# Patient Record
Sex: Female | Born: 1985 | Race: White | Hispanic: No | Marital: Married | Smoking: Former smoker
Health system: Southern US, Community
[De-identification: ages and names within clinical notes are randomized; demographics above are authoritative.]

## PROBLEM LIST (undated history)

## (undated) DIAGNOSIS — N3010 Interstitial cystitis (chronic) without hematuria: Secondary | ICD-10-CM

## (undated) DIAGNOSIS — R351 Nocturia: Secondary | ICD-10-CM

## (undated) DIAGNOSIS — D500 Iron deficiency anemia secondary to blood loss (chronic): Secondary | ICD-10-CM

## (undated) DIAGNOSIS — G8929 Other chronic pain: Secondary | ICD-10-CM

## (undated) DIAGNOSIS — Z87820 Personal history of traumatic brain injury: Secondary | ICD-10-CM

## (undated) DIAGNOSIS — R102 Pelvic and perineal pain: Secondary | ICD-10-CM

## (undated) HISTORY — PX: EXPLORATORY LAPAROTOMY: SUR591

---
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
## 2006-08-28 ENCOUNTER — Encounter: Admission: RE | Admit: 2006-08-28 | Discharge: 2006-08-28 | Payer: Self-pay | Admitting: Occupational Medicine

## 2007-04-03 ENCOUNTER — Emergency Department (HOSPITAL_COMMUNITY): Admission: EM | Admit: 2007-04-03 | Discharge: 2007-04-03 | Payer: Self-pay | Admitting: Emergency Medicine

## 2007-10-22 ENCOUNTER — Inpatient Hospital Stay (HOSPITAL_COMMUNITY): Admission: AD | Admit: 2007-10-22 | Discharge: 2007-10-22 | Payer: Self-pay | Admitting: Obstetrics

## 2007-12-22 ENCOUNTER — Inpatient Hospital Stay (HOSPITAL_COMMUNITY): Admission: AD | Admit: 2007-12-22 | Discharge: 2007-12-22 | Payer: Self-pay | Admitting: Obstetrics

## 2008-03-01 ENCOUNTER — Inpatient Hospital Stay (HOSPITAL_COMMUNITY): Admission: AD | Admit: 2008-03-01 | Discharge: 2008-03-05 | Payer: Self-pay | Admitting: Obstetrics

## 2008-03-03 ENCOUNTER — Encounter: Payer: Self-pay | Admitting: Obstetrics

## 2008-08-20 ENCOUNTER — Emergency Department (HOSPITAL_COMMUNITY): Admission: EM | Admit: 2008-08-20 | Discharge: 2008-08-20 | Payer: Self-pay | Admitting: Emergency Medicine

## 2009-07-16 ENCOUNTER — Emergency Department (HOSPITAL_COMMUNITY): Admission: EM | Admit: 2009-07-16 | Discharge: 2009-07-16 | Payer: Self-pay | Admitting: Emergency Medicine

## 2009-09-21 ENCOUNTER — Encounter: Admission: RE | Admit: 2009-09-21 | Discharge: 2009-09-21 | Payer: Self-pay | Admitting: Family Medicine

## 2010-02-20 ENCOUNTER — Encounter: Payer: Self-pay | Admitting: Family Medicine

## 2010-04-18 LAB — URINALYSIS, ROUTINE W REFLEX MICROSCOPIC
Glucose, UA: NEGATIVE mg/dL
Hgb urine dipstick: NEGATIVE
Ketones, ur: NEGATIVE mg/dL
Nitrite: NEGATIVE
Protein, ur: NEGATIVE mg/dL
Urobilinogen, UA: 0.2 mg/dL (ref 0.0–1.0)
pH: 5 (ref 5.0–8.0)

## 2010-04-18 LAB — POCT PREGNANCY, URINE: Preg Test, Ur: NEGATIVE

## 2010-04-18 LAB — URINE MICROSCOPIC-ADD ON

## 2010-05-09 LAB — URINALYSIS, ROUTINE W REFLEX MICROSCOPIC
Glucose, UA: NEGATIVE mg/dL
Ketones, ur: NEGATIVE mg/dL
Nitrite: NEGATIVE

## 2010-05-09 LAB — DIFFERENTIAL
Basophils Absolute: 0 10*3/uL (ref 0.0–0.1)
Basophils Relative: 1 % (ref 0–1)
Eosinophils Absolute: 0.1 10*3/uL (ref 0.0–0.7)
Eosinophils Relative: 1 % (ref 0–5)
Monocytes Absolute: 0.6 10*3/uL (ref 0.1–1.0)
Monocytes Relative: 7 % (ref 3–12)
Neutro Abs: 5.5 10*3/uL (ref 1.7–7.7)
Neutrophils Relative %: 69 % (ref 43–77)

## 2010-05-09 LAB — BASIC METABOLIC PANEL
BUN: 8 mg/dL (ref 6–23)
CO2: 27 mEq/L (ref 19–32)
Chloride: 107 mEq/L (ref 96–112)
Creatinine, Ser: 0.57 mg/dL (ref 0.4–1.2)
Glucose, Bld: 107 mg/dL — ABNORMAL HIGH (ref 70–99)

## 2010-05-09 LAB — CBC
MCHC: 34.7 g/dL (ref 30.0–36.0)
MCV: 87.7 fL (ref 78.0–100.0)
RBC: 4.38 MIL/uL (ref 3.87–5.11)

## 2010-05-09 LAB — POCT PREGNANCY, URINE: Preg Test, Ur: NEGATIVE

## 2010-05-17 LAB — DIFFERENTIAL
Basophils Absolute: 0.1 10*3/uL (ref 0.0–0.1)
Basophils Relative: 1 % (ref 0–1)
Eosinophils Absolute: 0 10*3/uL (ref 0.0–0.7)
Lymphocytes Relative: 17 % (ref 12–46)
Monocytes Absolute: 0.7 10*3/uL (ref 0.1–1.0)
Neutrophils Relative %: 76 % (ref 43–77)

## 2010-05-17 LAB — CBC
HCT: 38.6 % (ref 36.0–46.0)
Hemoglobin: 13 g/dL (ref 12.0–15.0)
MCHC: 33.5 g/dL (ref 30.0–36.0)
RDW: 12.6 % (ref 11.5–15.5)

## 2010-05-17 LAB — RPR: RPR Ser Ql: NONREACTIVE

## 2010-05-17 LAB — COMPREHENSIVE METABOLIC PANEL
AST: 25 U/L (ref 0–37)
Albumin: 2.8 g/dL — ABNORMAL LOW (ref 3.5–5.2)
Alkaline Phosphatase: 135 U/L — ABNORMAL HIGH (ref 39–117)
BUN: 3 mg/dL — ABNORMAL LOW (ref 6–23)
Potassium: 4.4 mEq/L (ref 3.5–5.1)
Total Protein: 5.7 g/dL — ABNORMAL LOW (ref 6.0–8.3)

## 2010-05-17 LAB — MAGNESIUM: Magnesium: 8.8 mg/dL (ref 1.5–2.5)

## 2010-05-17 LAB — WET PREP, GENITAL
Clue Cells Wet Prep HPF POC: NONE SEEN
Yeast Wet Prep HPF POC: NONE SEEN

## 2010-05-17 LAB — STREP B DNA PROBE

## 2010-05-18 LAB — CBC
Hemoglobin: 10.3 g/dL — ABNORMAL LOW (ref 12.0–15.0)
RBC: 3.42 MIL/uL — ABNORMAL LOW (ref 3.87–5.11)
WBC: 12.9 10*3/uL — ABNORMAL HIGH (ref 4.0–10.5)

## 2010-11-01 LAB — URINALYSIS, ROUTINE W REFLEX MICROSCOPIC
Glucose, UA: NEGATIVE
Specific Gravity, Urine: 1.01

## 2010-11-01 LAB — URINE MICROSCOPIC-ADD ON

## 2010-11-02 LAB — URINALYSIS, ROUTINE W REFLEX MICROSCOPIC
Bilirubin Urine: NEGATIVE
Nitrite: NEGATIVE
Specific Gravity, Urine: 1.02
Urobilinogen, UA: 0.2

## 2011-03-18 ENCOUNTER — Inpatient Hospital Stay (HOSPITAL_COMMUNITY)
Admission: AD | Admit: 2011-03-18 | Discharge: 2011-03-19 | Disposition: A | Discharge status: Home / Self Care | Payer: 59 | Source: Ambulatory Visit | Attending: Obstetrics and Gynecology | Admitting: Obstetrics and Gynecology

## 2011-03-18 DIAGNOSIS — N949 Unspecified condition associated with female genital organs and menstrual cycle: Secondary | ICD-10-CM | POA: Insufficient documentation

## 2011-03-18 NOTE — Progress Notes (Signed)
"  I stopped taking my medication about at the beginning of December, because we wanted to see how my body would react and we want to get pregnant.  The pain got really bad yesterday and it feels little needles are sticking me down there.  No pain or burning with urination.  No blood in urine.  The pain hurts worse when I have to go pee."

## 2011-03-18 NOTE — Progress Notes (Signed)
Pt states, " I stopped my medicine for interstitial cystitis in December because I wanted to see how my body reacted. I started having pain again yesterday. I took tramadal at 2 pm but it didn't help. I'm having nausea. I took at home pregnancy test and it was negative."

## 2011-03-19 ENCOUNTER — Inpatient Hospital Stay (HOSPITAL_COMMUNITY): Payer: 59

## 2011-03-19 ENCOUNTER — Encounter (HOSPITAL_COMMUNITY): Payer: Self-pay | Admitting: *Deleted

## 2011-03-19 ENCOUNTER — Emergency Department (HOSPITAL_COMMUNITY)
Admission: EM | Admit: 2011-03-19 | Discharge: 2011-03-19 | Disposition: A | Discharge status: Home / Self Care | Payer: 59 | Attending: Emergency Medicine | Admitting: Emergency Medicine

## 2011-03-19 DIAGNOSIS — R102 Pelvic and perineal pain: Secondary | ICD-10-CM

## 2011-03-19 DIAGNOSIS — R109 Unspecified abdominal pain: Secondary | ICD-10-CM | POA: Insufficient documentation

## 2011-03-19 DIAGNOSIS — R110 Nausea: Secondary | ICD-10-CM | POA: Insufficient documentation

## 2011-03-19 DIAGNOSIS — N3010 Interstitial cystitis (chronic) without hematuria: Secondary | ICD-10-CM

## 2011-03-19 DIAGNOSIS — N949 Unspecified condition associated with female genital organs and menstrual cycle: Secondary | ICD-10-CM | POA: Insufficient documentation

## 2011-03-19 HISTORY — DX: Interstitial cystitis (chronic) without hematuria: N30.10

## 2011-03-19 LAB — URINALYSIS, ROUTINE W REFLEX MICROSCOPIC
Ketones, ur: NEGATIVE mg/dL
Protein, ur: NEGATIVE mg/dL
Urobilinogen, UA: 0.2 mg/dL (ref 0.0–1.0)

## 2011-03-19 LAB — URINE MICROSCOPIC-ADD ON

## 2011-03-19 MED ORDER — HYDROCODONE-ACETAMINOPHEN 5-325 MG PO TABS: 2.0000 | ORAL_TABLET | Freq: Three times a day (TID) | ORAL | Status: DC | PRN

## 2011-03-19 MED ORDER — HYDROCODONE-ACETAMINOPHEN 5-325 MG PO TABS
2.0000 | ORAL_TABLET | Freq: Once | ORAL | Status: AC
  Administered 2011-03-19: 2 via ORAL
  Filled 2011-03-19: qty 2

## 2011-03-19 NOTE — ED Provider Notes (Signed)
History     CSN: [id number]  Arrival date & time 03/19/11  4098   First MD Initiated Contact with Patient 03/19/11 [phone]      Chief Complaint  Patient presents with  . Abdominal Pain    (Consider location/radiation/quality/duration/timing/severity/associated sxs/prior treatment) HPI This 26 year old female has a history of chronic pelvic pain and diagnosed with interstitial cystitis who is trying to become pregnant and stopped her medicine for interstitial cystitis in December. Since that time she has had gradually worsening pain in her pelvis. The pain is actually somewhat relieved when she urinates and she has no vaginal bleeding or discharge. She's also had no fever. She has no cough. Her abdomen hurts worse if she breathes but does nicely have exertional shortness of breath. She is no rashes no trauma. She does not have control of her pain with tramadol. She had a normal urinalysis and normal pelvic ultrasound last night at [facility] for the same problem. She has a follow up appointment with her gynecologist for this within 5 days. She used to have better control of her pain with Vicodin but did not get a prescription for that from [facility] hospital so she came to the ED here. She also has had some nausea but no vomiting or diarrhea. Her pain has become gradually severe over the last 2-3 days. Her pain is constant and radiates to her entire body without associated symptoms other than nausea. She did not have a pregnancy test performed at [facility] because she states she had a home pregnancy test that was negative. Past Medical History  Diagnosis Date  . Interstitial cystitis     History reviewed. No pertinent past surgical history.  History reviewed. No pertinent family history.  History  Substance Use Topics  . Smoking status: Not on file  . Smokeless tobacco: Not on file  . Alcohol Use: No    OB History    Grav Para Term Preterm Abortions TAB SAB Ect Mult Living                  Review of Systems  Constitutional: Negative for fever.       10 Systems reviewed and are negative for acute change except as noted in the HPI.  HENT: Negative for congestion.   Eyes: Negative for discharge and redness.  Respiratory: Negative for cough and shortness of breath.   Cardiovascular: Negative for chest pain.  Gastrointestinal: Positive for nausea and abdominal pain. Negative for vomiting and diarrhea.  Genitourinary: Negative for vaginal bleeding and vaginal discharge.  Musculoskeletal: Negative for back pain.  Skin: Negative for rash.  Neurological: Negative for syncope, numbness and headaches.  Psychiatric/Behavioral:       No behavior change.    Allergies  Review of patient's allergies indicates no known allergies.  Home Medications   Current Outpatient Rx  Name Route Sig Dispense Refill  . URIBEL PO Oral Take 1 capsule by mouth 3 (three) times a week. For bladder pain and frequent urination    . TRAMADOL HCL 50 MG PO TABS Oral Take 50 mg by mouth every 6 (six) hours as needed. For pain    . HYDROCODONE-ACETAMINOPHEN 5-325 MG PO TABS Oral Take 2 tablets by mouth every 8 (eight) hours as needed for pain. 20 tablet 0    BP 113/79  Pulse 87  Temp(Src) 97.7 F (36.5 C) (Oral)  Resp 20  SpO2 100%  LMP 03/04/2011  Physical Exam  Nursing note and vitals reviewed. Constitutional:  Awake, alert, nontoxic appearance.  HENT:  Head: Atraumatic.  Eyes: Right eye exhibits no discharge. Left eye exhibits no discharge.  Neck: Neck supple.  Cardiovascular: Normal rate and regular rhythm.   No murmur heard. Pulmonary/Chest: Effort normal and breath sounds normal. No respiratory distress. She has no wheezes. She has no rales. She exhibits no tenderness.  Abdominal: Soft. Bowel sounds are normal. She exhibits no mass. There is tenderness. There is no rebound and no guarding.       Moderate suprapubic tenderness only the rest of the abdomen is soft and  nontender and no rebound present; no CVA tenderness  Genitourinary:       Chaperone was present for bimanual examination with no adnexal tenderness no cervical motion tenderness no discharge or bleeding noted in the examination glove but the patient does have suprapubic tenderness only  Musculoskeletal: She exhibits no tenderness.       Baseline ROM, no obvious new focal weakness.  Neurological: She is alert.       Mental status and motor strength appears baseline for patient and situation.  Skin: No rash noted.  Psychiatric: She has a normal mood and affect.    ED Course  Procedures (including critical care time)   Labs Reviewed  POCT PREGNANCY, URINE   US Transvaginal Non-ob  03/19/2011  *RADIOLOGY REPORT*  Clinical Data: Pelvic pain; dyspareunia.  TRANSABDOMINAL AND TRANSVAGINAL ULTRASOUND OF PELVIS Technique:  Both transabdominal and transvaginal ultrasound examinations of the pelvis were performed. Transabdominal technique was performed for global imaging of the pelvis including uterus, ovaries, adnexal regions, and pelvic cul-de-sac.  Comparison: Prior ultrasound of pregnancy performed 03/01/2008   It was necessary to proceed with endovaginal exam following the transabdominal exam to visualize the uterus and ovaries in greater detail.  Findings:  Uterus: Normal in size and appearance; measures 7.1 x 3.5 x 4.2 cm.  Endometrium: Normal in thickness and appearance; measures 0.8 cm in thickness.  Right ovary:  Normal appearance/no adnexal mass; measures 4.1 x 2.6 x 2.7 cm.  Left ovary: Normal appearance/no adnexal mass; measures 2.6 x 1.8 x 1.6 cm.  There is no definite evidence for ovarian torsion.  Other findings: No free fluid seen within the pelvic cul-de-sac.  IMPRESSION: Normal study.  No evidence of pelvic mass or other significant abnormality.  Original Report Authenticated By: Tonia Ghent, M.D.   US Pelvis Complete  03/19/2011  *RADIOLOGY REPORT*  Clinical Data: Pelvic pain;  dyspareunia.  TRANSABDOMINAL AND TRANSVAGINAL ULTRASOUND OF PELVIS Technique:  Both transabdominal and transvaginal ultrasound examinations of the pelvis were performed. Transabdominal technique was performed for global imaging of the pelvis including uterus, ovaries, adnexal regions, and pelvic cul-de-sac.  Comparison: Prior ultrasound of pregnancy performed 03/01/2008   It was necessary to proceed with endovaginal exam following the transabdominal exam to visualize the uterus and ovaries in greater detail.  Findings:  Uterus: Normal in size and appearance; measures 7.1 x 3.5 x 4.2 cm.  Endometrium: Normal in thickness and appearance; measures 0.8 cm in thickness.  Right ovary:  Normal appearance/no adnexal mass; measures 4.1 x 2.6 x 2.7 cm.  Left ovary: Normal appearance/no adnexal mass; measures 2.6 x 1.8 x 1.6 cm.  There is no definite evidence for ovarian torsion.  Other findings: No free fluid seen within the pelvic cul-de-sac.  IMPRESSION: Normal study.  No evidence of pelvic mass or other significant abnormality.  Original Report Authenticated By: Tonia Ghent, M.D.     1. Pelvic pain   2. Interstitial  cystitis       MDM  I doubt any other EMC precluding discharge at this time including, but not necessarily limited to the following:SBI, peritonitis.        Hurman Horn, MD 03/19/11 2116

## 2011-03-19 NOTE — H&P (Signed)
Melinda Munoz, Melinda Munoz               ACCOUNT NO.:  [id number]  MEDICAL RECORD NO.:  [id number]  LOCATION:  [facility]10                          FACILITY:  [facility]  PHYSICIAN:  Lenoard Aden, M.D.DATE OF BIRTH:  1985-06-08  DATE OF ADMISSION:  03/18/2011 DATE OF DISCHARGE:  03/19/2011                             HISTORY & PHYSICAL   CHIEF COMPLAINT:  Pelvic pain.  The patient is a 26 year old white female, G1, P1 with a history of diagnosis of interstitial cystitis, previously on nortriptyline, who is now in attempts to conceive, off medication and attempting to get pregnant.  She has had suprapubic pain over the last 24-36 hours.  She has taken tramadol without relief.  She has allergies to CIPRO.  Medications are Uribel as needed, MiraLAX as needed, ibuprofen as needed, and tramadol as noted.  She has a previous history of one uncomplicated vaginal delivery.  She has a family history of diabetes, mental retardation, and skin cancer.  She is a nonsmoker, nondrinker.  Denies domestic or physical violence.  PHYSICAL EXAMINATION:  GENERAL:  She is a well-developed, well- nourished, white female, in no acute distress.  HEENT:  Normal. NECK:  Supple.  Full range of motion. LUNGS:  Clear. HEART:  Regular RHYTHM. ABDOMEN:  Soft, nontender, some positive suprapubic pressure. PELVIC:  Reveals normal-sized uterus and no adnexal masses. EXTREMITIES:  There are no cords. NEUROLOGIC:  Nonfocal. SKIN:  Intact.  IMPRESSION:  Pelvic pain related to previously diagnosed interstitial cystitis, per previous workup by Dr. Juliene Pina.  PLAN:  Will be to obtain pelvic ultrasound.  The patient had a negative pregnancy test at home.  We will discharge home pending ultrasound results.     Lenoard Aden, M.D.     RJT/MEDQ  D:  03/19/2011  T:  03/19/2011  Job:  [phone]

## 2011-03-19 NOTE — ED Notes (Signed)
Reports severe lower abd pain x 2 days, denies vaginal or urinary symptoms.

## 2011-03-19 NOTE — ED Provider Notes (Signed)
History   Suprapubic pain H/O IC  Chief Complaint  Patient presents with  . Pelvic Pain   HPI Dicatated  OB History    No data available      No past medical history on file.  No past surgical history on file.  No family history on file.  History  Substance Use Topics  . Smoking status: Not on file  . Smokeless tobacco: Not on file  . Alcohol Use: Not on file    Allergies: No Known Allergies  Prescriptions prior to admission  Medication Sig Dispense Refill  . Meth-Hyo-M Bl-Na Phos-Ph Sal (URIBEL PO) Take 2-3 capsules by mouth. 2-3 capsules per week as needed      . traMADol (ULTRAM) 50 MG tablet Take 50 mg by mouth once.        ROS otherwise neg Physical Exam dictated   Blood pressure 107/78, pulse 74, temperature 98.6 F (37 C), temperature source Oral, resp. rate 18, height 5\' 1"  (1.549 m), weight 66.452 kg (146 lb 8 oz), last menstrual period 03/04/2011.  Physical Exam dictated  MAU Course  Procedures  MDM na  Assessment and Plan  Suprapubic pain History of IC  Ck UA Ck sono DC home pending results  Maeghan Canny J 03/19/2011, 12:18 AM Sup

## 2011-03-19 NOTE — Discharge Instructions (Signed)
Interstitial Cystitis Interstitial cystitis (IC) is a condition that results in discomfort or pain in the bladder and the surrounding pelvic region. The symptoms can be different from case to case and even in the same individual. People may experience:  Mild discomfort.   Pressure.   Tenderness.   Intense pain in the bladder and pelvic area.  CAUSES  Because IC varies so much in symptoms and severity, people studying this disease believe it is not one but several diseases. Some caregivers use the term painful bladder syndrome (PBS) to describe cases with painful urinary symptoms. This may not meet the strictest definition of IC. The term IC / PBS includes all cases of urinary pain that cannot be connected to other causes, such as infection or urinary stones.  SYMPTOMS  Symptoms may include:  An urgent need to urinate.   A frequent need to urinate.   A combination of these symptoms.  Pain may change in intensity as the bladder fills with urine or as it empties. Women's symptoms often get worse during menstruation. They may sometimes experience pain with vaginal intercourse. Some of the symptoms of IC / PBS seem like those of bacterial infection. Tests do not show infection. IC / PBS is far more common in women than in men.  DIAGNOSIS  The diagnosis of IC / PBS is based on:  Presence of pain related to the bladder, usually along with problems of frequency and urgency.   Not finding other diseases that could cause the symptoms.   Diagnostic tests that help rule out other diseases include:   Urinalysis.   Urine culture.   Cystoscopy.   Biopsy of the bladder wall.   Distension of the bladder under anesthesia.   Urine cytology.   Laboratory examination of prostate secretions.  A biopsy is a tissue sample that can be looked at under a microscope. Samples of the bladder and urethra may be removed during a cystoscopy. A biopsy helps rule out bladder cancer. TREATMENT  Scientists  have not yet found a cure for IC / PBS. Patients with IC / PBS do not get better with antibiotic therapy. Caregivers cannot predict who will respond best to which treatment. Symptoms may disappear without explanation. Disappearing symptoms may coincide with an event such as a change in diet or treatment. Even when symptoms disappear, they may return after days, weeks, months, or years.  Because the causes of IC / PBS are unknown, current treatments are aimed at relieving symptoms. Many people are helped by one or a combination of the treatments. As researchers learn more about IC / PBS, the list of potential treatments will change. Patients should discuss their options with a caregiver. SURGERY  Surgery should be considered only if all available treatments have failed and the pain is disabling. Many approaches and techniques are used. Each approach has its own advantages and complications. Advantages and complications should be discussed with a urologist. Your caregiver may recommend consulting another urologist for a second opinion. Most caregivers are reluctant to operate because the outcome is unpredictable. Some people still have symptoms after surgery.   People considering surgery should discuss the potential risks and benefits, side effects, and long- and short-term complications with their family, as well as with people who have already had the procedure. Surgery requires anesthesia, hospitalization, and in some cases weeks or months of recovery. As the complexity of the procedure increases, so do the chances for complications and for failure.  HOME CARE INSTRUCTIONS   All   drugs, even those sold over the counter, have side effects. Patients should always consult a caregiver before using any drug for an extended amount of time. Only take over-the-counter or prescription medicines for pain, discomfort, or fever as directed by your caregiver.   Many patients feel that smoking makes their symptoms  worse. How the by-products of tobacco that are excreted in the urine affect IC / PBS is unknown. Smoking is the major known cause of bladder cancer. One of the best things smokers can do for their bladder and their overall health is to quit.   Many patients feel that gentle stretching exercises help relieve IC / PBS symptoms.   Methods vary, but basically patients decide to empty their bladder at designated times and use relaxation techniques and distractions to keep to the schedule. Gradually, patients try to lengthen the time between scheduled voids. A diary in which to record voiding times is usually helpful in keeping track of progress.  MAKE SURE YOU:   Understand these instructions.   Will watch your condition.   Will get help right away if you are not doing well or get worse.  Document Released: 09/18/2003 Document Revised: 09/29/2010 Document Reviewed: 12/03/2007 [facility] Patient Information 2012 [place], [place].  Abdominal (belly) pain can be caused by many things. Your caregiver performed an examination and possibly ordered blood/urine tests and imaging (CT scan, x-rays, ultrasound). Many cases can be observed and treated at home after initial evaluation in the emergency department. Even though you are being discharged home, abdominal pain can be unpredictable. Therefore, you need a repeated exam if your pain does not resolve, returns, or worsens. Most patients with abdominal pain don't have to be admitted to the hospital or have surgery, but serious problems like appendicitis and gallbladder attacks can start out as nonspecific pain. Many abdominal conditions cannot be diagnosed in one visit, so follow-up evaluations are very important. SEEK IMMEDIATE MEDICAL ATTENTION IF: The pain does not go away or becomes severe.  A temperature above 101 develops.  Repeated vomiting occurs (multiple episodes).  The pain becomes localized to portions of the abdomen. The right side could possibly be  appendicitis. In an adult, the left lower portion of the abdomen could be colitis or diverticulitis.  Blood is being passed in stools or vomit (bright red or black tarry stools).  Return also if you develop chest pain, difficulty breathing, dizziness or fainting, or become confused, poorly responsive, or inconsolable (young children).

## 2011-03-19 NOTE — Progress Notes (Signed)
Pt informed of order for 2 Tramadol.  Pt refused Tramadol stating, "I just want to go to [facility].  I've been sitting here for 4 hours and I'm hurting."

## 2011-03-23 ENCOUNTER — Other Ambulatory Visit: Payer: Self-pay | Admitting: Urology

## 2011-03-23 ENCOUNTER — Encounter (HOSPITAL_BASED_OUTPATIENT_CLINIC_OR_DEPARTMENT_OTHER): Payer: Self-pay | Admitting: *Deleted

## 2011-03-23 NOTE — Progress Notes (Signed)
NPO AFTER MN. ARRIVES AT 0615. NEEDS HG. URINE PREG DONE AT ED  03-19-11 (NEG). MAY TAKE HYDROCODONE AM OF SURG. W/ SIP OF WATER IF NEEDED.

## 2011-03-27 NOTE — H&P (Signed)
ms Problems  1. Bladder Pain 788.99  History of Present Illness  Melinda Munoz is a 26 yo WF sent in consultation by Dr. Elyn Peers for bladder pain.  She has been seen in the ER x 2.  She was seen initially at [facility] for the extreme pain.  She was then sent to [facility].  She had a pelvic there and was given pain meds.  She had a pelvic US and there were no abnormalitis but the probe caused severe bladder pain.  The pain began in mid 2012.  She was given nortriptyline.  She stopped both the nortriptyline and OCP's in December when she was trying to get pregnant.  Her symptoms have recurred .  She has been taking vicodin 2 every 8 hours.  She has no dysuria.  She has frequency q1hr but she drinks a lot of water.  She has nocturia up to q1hr.  She has urgency without incontinence.  The bladder pain is temporarily relieved by voiding.  She has dysparunia.  She has constipation and has to take a lot of stool softeners and Miralax.   She has had UTI's in the past but none since last spring.  She has had no GU surgery.  She is G1P1 with Vaginal delivery 9 weeks early.   She has had no GU surgery.   Past Medical History Problems  1. History of  No Medical Problems  Surgical History Problems  1. History of  No Surgical Problems  Current Meds 1. Vicodin TABS; Therapy: (Recorded:18Feb2013) to  Allergies Medication  1. No Known Drug Allergies  Family History Problems  1. Family history of  Diabetes Mellitus V18.0 2. Family history of  Family Health Status - Mother's Age 77. Family history of  Family Health Status Number Of Children 4. Family history of  Skin Cancer V16.8  Social History Problems    Alcohol Use   Marital History - Currently Married   Never A Smoker   Occupation: Denied    History of  Caffeine Use  Review of Systems Genitourinary, constitutional, skin, eye, otolaryngeal, hematologic/lymphatic, cardiovascular, pulmonary, endocrine, musculoskeletal, gastrointestinal,  neurological and psychiatric system(s) were reviewed and pertinent findings if present are noted.  Genitourinary: urinary frequency, urinary urgency, nocturia and dyspareunia, but no incontinence.  Gastrointestinal: nausea, abdominal pain and constipation. The patient presents with complaints of bright red blood per rectum (with each BM).  ENT: sinus problems.  Respiratory: shortness of breath (with the pain).  Endocrine: polydipsia.  Neurological: headache.    Vitals Vital Signs [Data Includes: Last 1 Day]  18Feb2013 10:21AM  BMI Calculated: 26.43 BSA Calculated: 1.62 Height: 5 ft 1 in Weight: 140 lb  Blood Pressure: 107 / 72 Temperature: 98.3 F Heart Rate: 97  Physical Exam Constitutional: Well nourished and well developed . No acute distress.  ENT:. The ears and nose are normal in appearance.  Neck: The appearance of the neck is normal and no neck mass is present.  Pulmonary: No respiratory distress and normal respiratory rhythm and effort.  Cardiovascular: Heart rate and rhythm are normal . No peripheral edema.  Abdomen: The abdomen is soft and nontender (except for mild SP tenderness). No masses are palpated. No CVA tenderness. No hernias are palpable. No hepatosplenomegaly noted. Genitourinary: Examination of the external genitalia shows normal female external genitalia. The urethra is normal in appearance. Urethral hypermobility is not present. Vaginal exam demonstrates a scant, thick and mucopurulent vaginal discharge, but no abnormalities and no uterine prolapse. No cystocele is  identified. No rectocele is identified. The cervix is is without abnormalities. There is no cervical motion tenderness The uterus is without abnormalities and non tender. The adnexa are palpably normal. The bladder is tender, but normal on palpation and not distended. The anus is normal on inspection (she may have small fissue at 11 o'clock). The perineum is normal on inspection.  Lymphatics: The femoral  and inguinal nodes are not enlarged or tender.  Skin: Normal skin turgor, no visible rash and no visible skin lesions.  Neuro/Psych:. Mood and affect are appropriate.    Results/Data Urine [Data Includes: Last 1 Day]  18Feb2013  COLOR: YELLOW  Reference Range YELLOW APPEARANCE: CLEAR  Reference Range CLEAR SPECIFIC GRAVITY: 1.015  Reference Range 1.005-1.030 pH: 5.5  Reference Range 5.0-8.0 GLUCOSE: NEG mg/dL Reference Range NEG BILIRUBIN: NEG  Reference Range NEG KETONE: NEG mg/dL Reference Range NEG BLOOD: TRACE  Abnormal Reference Range NEG PROTEIN: NEG mg/dL Reference Range NEG UROBILINOGEN: 0.2 mg/dL Reference Range 4.1-3.2 NITRITE: NEG  Reference Range NEG LEUKOCYTE ESTERASE: SMALL  Abnormal Reference Range NEG SQUAMOUS EPITHELIAL/HPF: FEW  Reference Range RARE WBC: 0-3 WBC/hpf Reference Range <4 RBC: 0-3 RBC/hpf Reference Range <4 BACTERIA: RARE  Reference Range RARE CRYSTALS: NONE SEEN  Reference Range NONE SEEN CASTS: NONE SEEN  Reference Range NONE SEEN  Old records or history reviewed:Marland Kitchen  PVR: Cathed PVR 120 ml.    Procedure  Cath PVR is 120cc.    She was given an anesthetic instillation with lidocaine and bicarb per routine.  She got a small amount of relief with the treatment.     Assessment Assessed  1. Bladder Pain 788.99   She has a painful bladder with symptoms consistent with IC.   Plan Bladder Pain (788.99)  1. Hydrocodone-Acetaminophen 5-325 MG Oral Tablet; 1 - 2 po q 4-6 hours prn pain; Therapy:  18Feb2013 to (Last Rx:18Feb2013) 2. Uribel 118 MG Oral Capsule; take 1 capsule   4  times a day prn; Therapy: 18Feb2013 to (Last  Rx:18Feb2013) 3. Anesthetic Cocktail  Done: 18Feb2013 4. Pelvic Exam  Done: 18Feb2013 5. Follow-up Schedule Surgery Office  Follow-up  Requested for: 18Feb2013 Health Maintenance (V70.0)  6. UA With REFLEX  Done: 18Feb2013 10:08AM   I discussed treatment options for IC including medical therapy, instillation therapy and  hydrodistention of the bladder.    She wants to go with hydrodistention so I reviewed the risks of bleeding, infection, bladder injury, difficulty voiding, increased pain, thrombotic events and anesthetic complications.  I explained that about 60% of pateints get pain relief with the hydrodistention.

## 2011-03-27 NOTE — Discharge Instructions (Addendum)
Cystoscopy (Bladder Exam) A cystoscopy is an examination of your urinary bladder with a cystoscope. A cystoscope is an instrument like a small telescope with strong lights and lenses. It is inserted into the bladder through the urethra (the opening into the bladder) and allows your caregiver to examine the inside of your bladder. The procedure causes little discomfort and can be done in a hospital or office. It is a diagnostic procedure to evaluate the inside of your bladder. It may involve x-rays to further evaluate the ureters or internal aspects of the kidneys. It may aid in the removal of urinary stones  or in taking tissue samples (biopsies) if necessary. The procedure is easier in females because of a shorter urethra. In a female, the procedure must be done through the penis. This often requires more sedation and more time to do the procedure. The procedure usually takes twenty minutes to one half hour for a female and approximately an hour for a female. LET YOUR CAREGIVERS KNOW ABOUT:  Allergies.   Medications taken including herbs, eye drops, over the counter medications, and creams.   Use of steroids (by mouth or creams).   Previous problems with anesthetics or novocaine.   Possibility of pregnancy, if this applies.   History of blood clots (thrombophlebitis).   History of bleeding or blood problems.   Previous surgery, especially where prosthetics have been used like hip or knee replacements, and heart valve replacements.   Other health problems.  BEFORE THE PROCEDURE  You should be present 60 minutes prior to your procedure or as directed.  PROCEDURE During the procedure, you will:  Be assisted by your urologist and a nurse.   Lie on a cystoscopy table with your knees elevated and legs apart and covered with a drape. For women this is the same position as when a pap smear is taken.   Have the urethral area or penis washed and covered with sterile towels.   Have an anesthetic  (numbing) jelly applied to the urethra. This is usually all that is required for females but males may also require sedation.   Have the cystoscope inserted through the urethra and into the bladder. Sterile fluid will flow through the cystoscope and into the bladder. This will expand the bladder and provide clear fluid for the urologist to look through and examine the interior of the bladder.   Be allowed to go home once you are doing well, are stable, and awake if you were given a sedative. If given a sedative, have someone give you a ride home.  AFTER THE PROCEDURE  You may have temporary bleeding and burning on urination.   Drink lots of fluids.  SEEK IMMEDIATE MEDICAL CARE IF:  There is an increase in blood in the urine or if you are passing clots.   You have difficulty in passing your urine   You develop chills and/or an unexplained oral temperature above 102 F (38.9 C).  Your caregiver will discuss your results with you following the procedure. This may be at a later time if you have been sedated. If other testing or biopsies were taken, ask your caregiver how you are to obtain the results. Remember it is your responsibility to get your results. Do not assume everything is normal if you do not hear from your caregiver. Document Released: 01/15/2000 Document Revised: 09/29/2010 Document Reviewed: 11/08/2007 [employer] Patient Information 2012 Ronald Lobo Anesthetic, Adult A doctor specialized in giving anesthesia (anesthesiologist) or a nurse specialized in giving anesthesia (  nurse anesthetist) gives medicine that makes you sleep while a procedure is performed (general anesthetic). Once the general anesthetic has been administered, you will be in a sleeplike state in which you feel no pain. After having a general anestheticyou may feel:   Dizzy.   Weak.   Drowsy.   Confused.  These feelings are normal and can be expected to last for up to 24 hours after the procedure is  completed.  LET YOUR CAREGIVER KNOW ABOUT:  Allergies you have.   Medications you are taking, including herbs, eye drops, over the counter medications, dietary supplements, and creams.   Previous problems you have had with anesthetics or numbing medicines.   Use of cigarettes, alcohol, or illicit drugs.   Possibility of pregnancy, if this applies.   History of bleeding or blood disorders, including blood clots and clotting disorders.   Previous surgeries you have had and types of anesthetics you have received.   Family medical history, especially anesthetic problems.   Other health problems.  BEFORE THE PROCEDURE  You may brush your teeth on the morning of surgery but you should have no solid food or non-clear liquids for a minimum of 8 hours prior to your procedure. Clear liquids (water, apple juice, black coffee, and tea) are acceptable in small amounts until 2 hours prior to your procedure.   You may take your regular medications the morning of your procedure unless your caregiver indicates otherwise.  AFTER THE PROCEDURE  After surgery, you will be taken to the recovery area where a nurse will monitor your progress. You will be allowed to go home when you are awake, stable, taking fluids well, and without serious pain or complications.   For the first 24 hours following an anesthetic:   Have a responsible person with you.   Do not drive a car. If you are alone, do not take public transportation.   Do not engage in strenuous activity. You may usually resume normal activities the next day, or as advised by your caregiver.   Do not drink alcohol.   Do not take medicine that has not been prescribed by your caregiver.   Do not sign important papers or make important decisions as your judgement may be impaired.   You may resume a normal diet as directed.   Change bandages (dressings) as directed.   Only take over-the-counter or prescription medicines for pain,  discomfort, or fever as directed by your caregiver.  If you have questions or problems that seem related to the anesthetic, call the hospital and ask for the anesthetist, anesthesiologist, or anesthesia department. SEEK IMMEDIATE MEDICAL CARE IF:   You develop a rash.   You have difficulty breathing.   You have chest pain.   You have allergic problems.   You have uncontrolled nausea.   You have uncontrolled vomiting.   You develop any serious bleeding, especially from the incision site.  Document Released: 04/26/2007 Document Revised: 09/29/2010 Document Reviewed: 05/20/2010 [facility] Patient Information 2012 [place], [place].Marland Kitchen

## 2011-03-28 ENCOUNTER — Encounter (HOSPITAL_BASED_OUTPATIENT_CLINIC_OR_DEPARTMENT_OTHER)
Admission: RE | Disposition: A | Discharge status: Home / Self Care | Payer: Self-pay | Source: Ambulatory Visit | Attending: Urology

## 2011-03-28 ENCOUNTER — Encounter (HOSPITAL_BASED_OUTPATIENT_CLINIC_OR_DEPARTMENT_OTHER): Payer: Self-pay | Admitting: Anesthesiology

## 2011-03-28 ENCOUNTER — Ambulatory Visit (HOSPITAL_BASED_OUTPATIENT_CLINIC_OR_DEPARTMENT_OTHER): Payer: 59 | Admitting: Anesthesiology

## 2011-03-28 ENCOUNTER — Encounter (HOSPITAL_BASED_OUTPATIENT_CLINIC_OR_DEPARTMENT_OTHER): Payer: Self-pay | Admitting: *Deleted

## 2011-03-28 ENCOUNTER — Ambulatory Visit (HOSPITAL_BASED_OUTPATIENT_CLINIC_OR_DEPARTMENT_OTHER)
Admission: RE | Admit: 2011-03-28 | Discharge: 2011-03-28 | Disposition: A | Discharge status: Home / Self Care | Payer: 59 | Source: Ambulatory Visit | Attending: Urology | Admitting: Urology

## 2011-03-28 DIAGNOSIS — R3989 Other symptoms and signs involving the genitourinary system: Secondary | ICD-10-CM | POA: Diagnosis present

## 2011-03-28 DIAGNOSIS — IMO0002 Reserved for concepts with insufficient information to code with codable children: Secondary | ICD-10-CM | POA: Insufficient documentation

## 2011-03-28 DIAGNOSIS — R351 Nocturia: Secondary | ICD-10-CM | POA: Insufficient documentation

## 2011-03-28 HISTORY — DX: Nocturia: R35.1

## 2011-03-28 HISTORY — PX: CYSTO WITH HYDRODISTENSION: SHX5453

## 2011-03-28 HISTORY — DX: Other chronic pain: G89.29

## 2011-03-28 HISTORY — DX: Pelvic and perineal pain: R10.2

## 2011-03-28 HISTORY — DX: Personal history of traumatic brain injury: Z87.820

## 2011-03-28 SURGERY — CYSTOSCOPY, WITH BLADDER HYDRODISTENSION
Anesthesia: General | Site: Bladder | Wound class: Clean Contaminated

## 2011-03-28 MED ORDER — PROMETHAZINE HCL 25 MG/ML IJ SOLN: 6.2500 mg | INTRAMUSCULAR | Status: DC | PRN

## 2011-03-28 MED ORDER — ACETAMINOPHEN 650 MG RE SUPP: 650.0000 mg | RECTAL | Status: DC | PRN

## 2011-03-28 MED ORDER — OXYCODONE HCL 5 MG PO TABS
5.0000 mg | ORAL_TABLET | ORAL | Status: DC | PRN
  Administered 2011-03-28: 5 mg via ORAL

## 2011-03-28 MED ORDER — SODIUM CHLORIDE 0.9 % IJ SOLN: 3.0000 mL | INTRAMUSCULAR | Status: DC | PRN

## 2011-03-28 MED ORDER — PROMETHAZINE HCL 25 MG/ML IJ SOLN: 12.5000 mg | Freq: Four times a day (QID) | INTRAMUSCULAR | Status: DC | PRN

## 2011-03-28 MED ORDER — FENTANYL CITRATE 0.05 MG/ML IJ SOLN: 25.0000 ug | INTRAMUSCULAR | Status: DC | PRN

## 2011-03-28 MED ORDER — FENTANYL CITRATE 0.05 MG/ML IJ SOLN
INTRAMUSCULAR | Status: DC | PRN
  Administered 2011-03-28 (×2): 50 ug via INTRAVENOUS

## 2011-03-28 MED ORDER — PROPOFOL 10 MG/ML IV EMUL
INTRAVENOUS | Status: DC | PRN
  Administered 2011-03-28: 30 mg via INTRAVENOUS
  Administered 2011-03-28: 160 mg via INTRAVENOUS

## 2011-03-28 MED ORDER — MIDAZOLAM HCL 5 MG/5ML IJ SOLN
INTRAMUSCULAR | Status: DC | PRN
  Administered 2011-03-28: 2 mg via INTRAVENOUS

## 2011-03-28 MED ORDER — ONDANSETRON HCL 4 MG/2ML IJ SOLN: 4.0000 mg | Freq: Four times a day (QID) | INTRAMUSCULAR | Status: DC | PRN

## 2011-03-28 MED ORDER — PHENAZOPYRIDINE HCL 200 MG PO TABS
ORAL | Status: DC | PRN
  Administered 2011-03-28: 08:00:00 via INTRAVESICAL

## 2011-03-28 MED ORDER — CIPROFLOXACIN IN D5W 400 MG/200ML IV SOLN
400.0000 mg | INTRAVENOUS | Status: DC
  Administered 2011-03-28: 400 mg via INTRAVENOUS

## 2011-03-28 MED ORDER — CEPHALEXIN 500 MG PO CAPS: 500.0000 mg | ORAL_CAPSULE | Freq: Three times a day (TID) | ORAL | Status: AC

## 2011-03-28 MED ORDER — PHENAZOPYRIDINE HCL 200 MG PO TABS: 200.0000 mg | ORAL_TABLET | Freq: Three times a day (TID) | ORAL | Status: AC | PRN

## 2011-03-28 MED ORDER — BELLADONNA ALKALOIDS-OPIUM 16.2-60 MG RE SUPP
RECTAL | Status: DC | PRN
  Administered 2011-03-28: 1 via RECTAL

## 2011-03-28 MED ORDER — FENTANYL CITRATE 0.05 MG/ML IJ SOLN
25.0000 ug | INTRAMUSCULAR | Status: DC | PRN
  Administered 2011-03-28: 25 ug via INTRAVENOUS

## 2011-03-28 MED ORDER — ACETAMINOPHEN 325 MG PO TABS: 650.0000 mg | ORAL_TABLET | ORAL | Status: DC | PRN

## 2011-03-28 MED ORDER — CEFAZOLIN SODIUM 1-5 GM-% IV SOLN
1.0000 g | Freq: Three times a day (TID) | INTRAVENOUS | Status: DC
  Administered 2011-03-28: 1 g via INTRAVENOUS

## 2011-03-28 MED ORDER — ONDANSETRON HCL 4 MG/2ML IJ SOLN
INTRAMUSCULAR | Status: DC | PRN
  Administered 2011-03-28: 4 mg via INTRAVENOUS

## 2011-03-28 MED ORDER — SODIUM CHLORIDE 0.9 % IV SOLN: 250.0000 mL | INTRAVENOUS | Status: DC | PRN

## 2011-03-28 MED ORDER — STERILE WATER FOR IRRIGATION IR SOLN
Status: DC | PRN
  Administered 2011-03-28: 3000 mL

## 2011-03-28 MED ORDER — SODIUM CHLORIDE 0.9 % IJ SOLN: 3.0000 mL | Freq: Two times a day (BID) | INTRAMUSCULAR | Status: DC

## 2011-03-28 MED ORDER — KETOROLAC TROMETHAMINE 30 MG/ML IJ SOLN
INTRAMUSCULAR | Status: DC | PRN
  Administered 2011-03-28: 30 mg via INTRAVENOUS

## 2011-03-28 MED ORDER — LIDOCAINE HCL (CARDIAC) 20 MG/ML IV SOLN
INTRAVENOUS | Status: DC | PRN
  Administered 2011-03-28: 50 mg via INTRAVENOUS

## 2011-03-28 MED ORDER — DEXAMETHASONE SODIUM PHOSPHATE 4 MG/ML IJ SOLN
INTRAMUSCULAR | Status: DC | PRN
  Administered 2011-03-28: 10 mg via INTRAVENOUS

## 2011-03-28 MED ORDER — LACTATED RINGERS IV SOLN
INTRAVENOUS | Status: DC
  Administered 2011-03-28: 07:00:00 via INTRAVENOUS

## 2011-03-28 MED ORDER — PHENAZOPYRIDINE HCL 200 MG PO TABS
200.0000 mg | ORAL_TABLET | Freq: Three times a day (TID) | ORAL | Status: DC
  Administered 2011-03-28: 200 mg via ORAL

## 2011-03-28 SURGICAL SUPPLY — 22 items
BAG DRAIN URO-CYSTO SKYTR STRL (DRAIN) ×2 IMPLANT
BAG DRN UROCATH (DRAIN) ×1
CANISTER SUCT LVC 12 LTR MEDI- (MISCELLANEOUS) ×1 IMPLANT
CATH ROBINSON RED A/P 16FR (CATHETERS) ×2 IMPLANT
CLOTH BEACON ORANGE TIMEOUT ST (SAFETY) ×2 IMPLANT
DRAPE CAMERA CLOSED 9X96 (DRAPES) ×2 IMPLANT
ELECT REM PT RETURN 9FT ADLT (ELECTROSURGICAL)
ELECTRODE REM PT RTRN 9FT ADLT (ELECTROSURGICAL) ×1 IMPLANT
GLOVE BIO SURGEON STRL SZ7.5 (GLOVE) ×1 IMPLANT
GLOVE BIOGEL PI IND STRL 6.5 (GLOVE) IMPLANT
GLOVE BIOGEL PI IND STRL 7.5 (GLOVE) IMPLANT
GLOVE BIOGEL PI INDICATOR 6.5 (GLOVE) ×1
GLOVE BIOGEL PI INDICATOR 7.5 (GLOVE) ×1
GLOVE SURG SS PI 8.0 STRL IVOR (GLOVE) ×2 IMPLANT
GOWN STRL NON-REIN LRG LVL3 (GOWN DISPOSABLE) ×1 IMPLANT
GOWN STRL REIN XL XLG (GOWN DISPOSABLE) ×2 IMPLANT
NDL SAFETY ECLIPSE 18X1.5 (NEEDLE) ×1 IMPLANT
NEEDLE HYPO 18GX1.5 SHARP (NEEDLE) ×2
NS IRRIG 500ML POUR BTL (IV SOLUTION) IMPLANT
PACK CYSTOSCOPY (CUSTOM PROCEDURE TRAY) ×2 IMPLANT
SYR 30ML LL (SYRINGE) ×2 IMPLANT
WATER STERILE IRR 3000ML UROMA (IV SOLUTION) ×2 IMPLANT

## 2011-03-28 NOTE — Anesthesia Postprocedure Evaluation (Signed)
  Anesthesia Post-op Note  Patient: Melinda Munoz  Procedure(s) Performed: Procedure(s) (LRB): CYSTOSCOPY/HYDRODISTENSION (N/A)  Patient Location: PACU  Anesthesia Type: General  Level of Consciousness: awake and alert   Airway and Oxygen Therapy: Patient Spontanous Breathing  Post-op Pain: mild  Post-op Assessment: Post-op Vital signs reviewed, Patient's Cardiovascular Status Stable, Respiratory Function Stable, Patent Airway and No signs of Nausea or vomiting  Post-op Vital Signs: stable  Complications: No apparent anesthesia complications

## 2011-03-28 NOTE — Transfer of Care (Signed)
Immediate Anesthesia Transfer of Care Note  Patient: Melinda Munoz  Procedure(s) Performed: Procedure(s) (LRB): CYSTOSCOPY/HYDRODISTENSION (N/A)  Patient Location: PACU  Anesthesia Type: General  Level of Consciousness: sedated  Airway & Oxygen Therapy: Patient Spontanous Breathing and Patient connected to nasal cannula oxygen  Post-op Assessment: Report given to PACU RN and Post -op Vital signs reviewed and stable  Post vital signs: Reviewed and stable  Complications: No apparent anesthesia complications

## 2011-03-28 NOTE — Interval H&P Note (Signed)
History and Physical Interval Note:  03/28/2011 7:21 AM  Melinda Munoz  has presented today for surgery, with the diagnosis of PAINFUL BLADDER  The various methods of treatment have been discussed with the patient and family. After consideration of risks, benefits and other options for treatment, the patient has consented to  Procedure(s) (LRB): CYSTOSCOPY/HYDRODISTENSION (N/A) as a surgical intervention .  The patients' history has been reviewed, patient examined, no change in status, stable for surgery.  I have reviewed the patients' chart and labs.  Questions were answered to the patient's satisfaction.     [name]

## 2011-03-28 NOTE — Anesthesia Preprocedure Evaluation (Signed)
Anesthesia Evaluation  Patient identified by MRN, date of birth, ID band Patient awake    Reviewed: Allergy & Precautions, H&P , NPO status , Patient's Chart, lab work & pertinent test results  Airway Mallampati: II TM Distance: >3 FB Neck ROM: Full    Dental No notable dental hx.    Pulmonary neg pulmonary ROS,  clear to auscultation  Pulmonary exam normal       Cardiovascular neg cardio ROS Regular Normal    Neuro/Psych  Headaches, Negative Psych ROS   GI/Hepatic negative GI ROS, Neg liver ROS,   Endo/Other  Negative Endocrine ROS  Renal/GU negative Renal ROS  Genitourinary negative   Musculoskeletal negative musculoskeletal ROS (+)   Abdominal   Peds negative pediatric ROS (+)  Hematology negative hematology ROS (+)   Anesthesia Other Findings   Reproductive/Obstetrics negative OB ROS                           Anesthesia Physical Anesthesia Plan  ASA: II  Anesthesia Plan: General   Post-op Pain Management:    Induction: Intravenous  Airway Management Planned: LMA  Additional Equipment:   Intra-op Plan:   Post-operative Plan: Extubation in OR  Informed Consent: I have reviewed the patients History and Physical, chart, labs and discussed the procedure including the risks, benefits and alternatives for the proposed anesthesia with the patient or authorized representative who has indicated his/her understanding and acceptance.   Dental advisory given  Plan Discussed with: CRNA  Anesthesia Plan Comments:         Anesthesia Quick Evaluation

## 2011-03-28 NOTE — Anesthesia Procedure Notes (Addendum)
Procedure Name: LMA Insertion Performed by: Aijalon Kirtz Pre-anesthesia Checklist: Patient identified, Emergency Drugs available, Suction available and Patient being monitored Patient Re-evaluated:Patient Re-evaluated prior to inductionOxygen Delivery Method: Circle System Utilized Preoxygenation: Pre-oxygenation with 100% oxygen Intubation Type: IV induction Ventilation: Mask ventilation without difficulty LMA: LMA inserted LMA Size: 4.0 Number of attempts: 1 Airway Equipment and Method: bite block Placement Confirmation: positive ETCO2 Dental Injury: Teeth and Oropharynx as per pre-operative assessment      

## 2011-03-28 NOTE — Progress Notes (Signed)
Reviewed allergies with patient. Patient stated NKDA. Cipro started as ordered. Patient c/o nausea and then stated that she was allergic to Cipro and that her reaction was hives. Cipro stopped and IV tubing changed. Patient without c/o at this time. No hives noted.

## 2011-03-28 NOTE — Progress Notes (Signed)
Received report as caregiver. 

## 2011-03-28 NOTE — Brief Op Note (Signed)
03/28/2011  7:44 AM  PATIENT:  Melinda Munoz  25 y.o. female  PRE-OPERATIVE DIAGNOSIS:  PAINFUL BLADDER  POST-OPERATIVE DIAGNOSIS:  PAINFUL BLADDER WITH INTERSTITIAL CYSTITIS  PROCEDURE:  Procedure(s) (LRB): CYSTOSCOPY/HYDRODISTENSION WITH INSTILLATION OF PYRIDIUM AND MARCAINE (N/A)  SURGEON:  Surgeon(s) and Role:    * Anner Crete, MD - Primary  PHYSICIAN ASSISTANT:   ASSISTANTS: none   ANESTHESIA:   general  EBL:  Total I/O In: 100 [I.V.:100] Out: -   BLOOD ADMINISTERED:none  DRAINS: none   LOCAL MEDICATIONS USED:  MARCAINE     SPECIMEN:  No Specimen  DISPOSITION OF SPECIMEN:  N/A  COUNTS:  YES  TOURNIQUET:  * No tourniquets in log *  DICTATION: .Other Dictation: Dictation Number [id number]  PLAN OF CARE: Discharge to home after PACU  PATIENT DISPOSITION:  PACU - hemodynamically stable.   Delay start of Pharmacological VTE agent (>24hrs) due to surgical blood loss or risk of bleeding: no

## 2011-03-29 ENCOUNTER — Encounter (HOSPITAL_BASED_OUTPATIENT_CLINIC_OR_DEPARTMENT_OTHER): Payer: Self-pay | Admitting: Urology

## 2011-03-29 NOTE — Op Note (Signed)
[name], [name]               ACCOUNT NO.:  [id number]  MEDICAL RECORD NO.:  [id number]  LOCATION:                                 FACILITY:  PHYSICIAN:  Excell Seltzer. Annabell Howells, M.D.    DATE OF BIRTH:  1985-07-10  DATE OF PROCEDURE:  03/28/2011 DATE OF DISCHARGE:                              OPERATIVE REPORT   The patient of Dr. Annabell Howells.  PROCEDURE:  Cystoscopy, hydrodistention of bladder, installation of Pyridium and Marcaine.  PREOPERATIVE DIAGNOSIS:  Painful bladder.  POSTOPERATIVE DIAGNOSIS:  Painful bladder with findings consistent with interstitial cystitis.  SURGEON:  Excell Seltzer. Annabell Howells, M.D.  ANESTHESIA:  General.  SPECIMEN:  None.  DRAINS:  None.  COMPLICATIONS:  None.  INDICATIONS:  Melinda Munoz is a 26 year old white female with a painful bladder and tenderness of the bladder on exam who is being evaluated for interstitial cystitis.  FINDINGS OF PROCEDURE:  She was initially given Cipro, but had hives, so she was switched to Ancef.  She was taken to operating room where general anesthetic was induced.  She was placed in lithotomy position. Her perineum and genitalia were prepped with Betadine solution and she was draped in usual sterile fashion.  Cystoscopy was performed using the 22-French scope and 12-degree lens. Examination revealed normal urethra.  The bladder wall was smooth and pale without tumor, stones, or inflammation.  Ureteral orifices were unremarkable.  The bladder was then filled to capacity under 80 cm of water pressure. This was held for 2 minutes and the bladder was drained.  The bladder capacity under anesthesia was 950 cc.  Repeat cystoscopy after hydrodistention demonstrated glomerulations of the trigone and the lateral walls and bladder neck consistent with interstitial cystitis.  The bladder was drained and a red rubber catheter was used to instill 15 cc of 0.5% Marcaine with 400 mg of crushed Pyridium into the bladder.  A B and O suppository  was then placed.  The patient was taken down from lithotomy position.  Her anesthetic was reversed.  She was moved to recovery in stable condition.  There were no complications.     Excell Seltzer. Annabell Howells, M.D.     JJW/MEDQ  D:  03/28/2011  T:  03/29/2011  Job:  [id number]  cc:   Dema Severin, NP

## 2011-10-28 LAB — OB RESULTS CONSOLE HEPATITIS B SURFACE ANTIGEN: Hepatitis B Surface Ag: NEGATIVE

## 2011-10-28 LAB — OB RESULTS CONSOLE ANTIBODY SCREEN: Antibody Screen: NEGATIVE

## 2011-10-28 LAB — OB RESULTS CONSOLE ABO/RH: RH Type: POSITIVE

## 2012-02-01 NOTE — L&D Delivery Note (Signed)
Delivery Note At 11:33 AM a viable and healthy female was delivered via Vaginal, Spontaneous Delivery (Presentation: Left Occiput Anterior).  APGAR: 9,9 ; weight pending.   Placenta status: Intact, Spontaneous.  Cord: 3 vessels with the following complications: None.  Cord pH: na Loose body cord x one.  Anesthesia: Epidural  Episiotomy: None Lacerations: None Suture Repair: none Est. Blood Loss (mL): 300  Mom to postpartum.  Baby to nursery-stable.  Melinda Munoz 05/07/2012, 11:47 AM

## 2012-04-09 ENCOUNTER — Encounter (HOSPITAL_COMMUNITY): Payer: Self-pay

## 2012-04-09 ENCOUNTER — Inpatient Hospital Stay (HOSPITAL_COMMUNITY)
Admission: AD | Admit: 2012-04-09 | Discharge: 2012-04-09 | Disposition: A | Discharge status: Home / Self Care | Payer: Managed Care, Other (non HMO) | Source: Ambulatory Visit | Attending: Obstetrics and Gynecology | Admitting: Obstetrics and Gynecology

## 2012-04-09 DIAGNOSIS — N949 Unspecified condition associated with female genital organs and menstrual cycle: Secondary | ICD-10-CM | POA: Insufficient documentation

## 2012-04-09 DIAGNOSIS — O4700 False labor before 37 completed weeks of gestation, unspecified trimester: Secondary | ICD-10-CM | POA: Insufficient documentation

## 2012-04-09 LAB — URINALYSIS, ROUTINE W REFLEX MICROSCOPIC
Glucose, UA: NEGATIVE mg/dL
Hgb urine dipstick: NEGATIVE
Ketones, ur: 15 mg/dL — AB
pH: 5.5 (ref 5.0–8.0)

## 2012-04-09 LAB — URINE MICROSCOPIC-ADD ON

## 2012-04-09 LAB — FETAL FIBRONECTIN: Fetal Fibronectin: NEGATIVE

## 2012-04-09 NOTE — H&P (Signed)
Melinda Munoz, Melinda Munoz               ACCOUNT NO.:  [id number]  MEDICAL RECORD NO.:  [id number]  LOCATION:  EX52                          FACILITY:  [facility]  PHYSICIAN:  Lenoard Aden, M.D.DATE OF BIRTH:  1985/02/28  DATE OF ADMISSION:  04/09/2012 DATE OF DISCHARGE:                             HISTORY & PHYSICAL   CHIEF COMPLAINT:  Vaginal pressure.  A 27 year old white female G2, P1, with a history of preterm birth who presents for increased vaginal pressure.  She has  allergies to Cipro and Makena.  Nonsmoker and nondrinker.  She denies domestic or physical violence.  History of spontaneous vaginal delivery at 31 weeks.  She has a family history of diabetes and mental retardation.  MEDICATIONS:  Include Phenergan p.r.n., Fioricet p.r.n., Benadryl p.r.n., prenatal vitamins as needed.  PHYSICAL EXAMINATION:  GENERAL:  A well-developed, well-nourished white female, in no acute distress. HEENT:  Normal. NECK:  Supple.  Full range of motion. LUNGS:  Clear. HEART:  Regular rate and rhythm. ABDOMEN:  Soft, gravid, and  nontender.  No CVA tenderness. EXTREMITIES:  There are no cords. NEUROLOGIC:  Nonfocal. CERVIX:  Closed, 6, -2 per RN. SKIN:  Intact.  NST is reactive.  Rare contractions noted except gram negative and urine negative.  IMPRESSION:  Thirty-two week intrauterine pregnancy.  No evidence of preterm.  PLAN:  Discharge home.  Follow up in the office as scheduled.     Lenoard Aden, M.D.     RJT/MEDQ  D:  04/09/2012  T:  04/09/2012  Job:  [id number]

## 2012-04-09 NOTE — MAU Note (Signed)
C/o intermittent abdominal pain for past 2 days; pressure and pain increased around 1500 this afternoon;
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
## 2012-04-20 LAB — OB RESULTS CONSOLE GC/CHLAMYDIA: Gonorrhea: NEGATIVE

## 2012-04-20 LAB — OB RESULTS CONSOLE GBS: GBS: NEGATIVE

## 2012-04-21 ENCOUNTER — Encounter (HOSPITAL_COMMUNITY): Payer: Self-pay | Admitting: *Deleted

## 2012-04-21 ENCOUNTER — Inpatient Hospital Stay (HOSPITAL_COMMUNITY)
Admission: AD | Admit: 2012-04-21 | Discharge: 2012-04-21 | Disposition: A | Discharge status: Home / Self Care | Payer: Managed Care, Other (non HMO) | Source: Ambulatory Visit | Attending: Obstetrics and Gynecology | Admitting: Obstetrics and Gynecology

## 2012-04-21 DIAGNOSIS — O4700 False labor before 37 completed weeks of gestation, unspecified trimester: Secondary | ICD-10-CM | POA: Insufficient documentation

## 2012-04-21 DIAGNOSIS — O4703 False labor before 37 completed weeks of gestation, third trimester: Secondary | ICD-10-CM
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
## 2012-04-21 NOTE — MAU Note (Signed)
Pt reports having contractions around 930. Stated she lost her mucus plug earlier today. Pt taking procardia for PTL. Dose is due now

## 2012-05-05 ENCOUNTER — Inpatient Hospital Stay (HOSPITAL_COMMUNITY)
Admission: AD | Admit: 2012-05-05 | Discharge: 2012-05-06 | DRG: 778 | Disposition: A | Discharge status: Home / Self Care | Payer: Managed Care, Other (non HMO) | Source: Ambulatory Visit | Attending: Obstetrics | Admitting: Obstetrics

## 2012-05-05 ENCOUNTER — Encounter (HOSPITAL_COMMUNITY): Payer: Self-pay

## 2012-05-05 DIAGNOSIS — O368390 Maternal care for abnormalities of the fetal heart rate or rhythm, unspecified trimester, not applicable or unspecified: Secondary | ICD-10-CM | POA: Diagnosis present

## 2012-05-05 DIAGNOSIS — O4700 False labor before 37 completed weeks of gestation, unspecified trimester: Principal | ICD-10-CM | POA: Diagnosis present

## 2012-05-05 LAB — TYPE AND SCREEN: ABO/RH(D): O POS

## 2012-05-05 LAB — CBC
MCH: 28.3 pg (ref 26.0–34.0)
MCHC: 33.9 g/dL (ref 30.0–36.0)
Platelets: 211 10*3/uL (ref 150–400)

## 2012-05-05 MED ORDER — LACTATED RINGERS IV SOLN: 500.0000 mL | INTRAVENOUS | Status: DC | PRN

## 2012-05-05 MED ORDER — BUTORPHANOL TARTRATE 1 MG/ML IJ SOLN
1.0000 mg | Freq: Once | INTRAMUSCULAR | Status: AC
  Administered 2012-05-05: 1 mg via INTRAVENOUS
  Filled 2012-05-05: qty 1

## 2012-05-05 MED ORDER — CITRIC ACID-SODIUM CITRATE 334-500 MG/5ML PO SOLN: 30.0000 mL | ORAL | Status: DC | PRN

## 2012-05-05 MED ORDER — LIDOCAINE HCL (PF) 1 % IJ SOLN
30.0000 mL | INTRAMUSCULAR | Status: DC | PRN
  Filled 2012-05-05: qty 30

## 2012-05-05 MED ORDER — ACETAMINOPHEN 325 MG PO TABS: 650.0000 mg | ORAL_TABLET | ORAL | Status: DC | PRN

## 2012-05-05 MED ORDER — LACTATED RINGERS IV SOLN
INTRAVENOUS | Status: DC
  Administered 2012-05-05 – 2012-05-06 (×2): via INTRAVENOUS

## 2012-05-05 MED ORDER — OXYTOCIN BOLUS FROM INFUSION: 500.0000 mL | INTRAVENOUS | Status: DC

## 2012-05-05 MED ORDER — IBUPROFEN 600 MG PO TABS: 600.0000 mg | ORAL_TABLET | Freq: Four times a day (QID) | ORAL | Status: DC | PRN

## 2012-05-05 MED ORDER — OXYCODONE-ACETAMINOPHEN 5-325 MG PO TABS: 1.0000 | ORAL_TABLET | ORAL | Status: DC | PRN

## 2012-05-05 MED ORDER — OXYTOCIN 40 UNITS IN LACTATED RINGERS INFUSION - SIMPLE MED: 62.5000 mL/h | INTRAVENOUS | Status: DC

## 2012-05-05 MED ORDER — ONDANSETRON HCL 4 MG/2ML IJ SOLN
4.0000 mg | Freq: Four times a day (QID) | INTRAMUSCULAR | Status: DC | PRN
  Administered 2012-05-06: 4 mg via INTRAVENOUS
  Filled 2012-05-05: qty 2

## 2012-05-05 NOTE — Progress Notes (Signed)
Deceleration down to 90s for 40-50 sec position changed to left lateral, O2 sat applied.

## 2012-05-05 NOTE — H&P (Signed)
CC: contractions and back pain  HPI: 27 yo G1P0101 with prior h/o PTL at 31 wks, was able to tocolyze for 3 days to get BMZ. No etiology of PTL. Prior SVD. Pt tried 17-P this preg, tried po prog, vag prog, procardia but side effects. Stopped Procardia 1 wk ago. Pt did BMX x 2 at 31 wks when she stopped 17-P and cervical change noted. Pt has been on bed rest.   No ROM, no VB, good FH.   Planning epidural and SVD  PObstetric Hx: - GBS neg - s/p BMZ 3/11, 3/12 - h/o PTD, 31 wks, SVD 3'6 - 34 u/s: 5'3  PMH: IC, migraine  Meds: tylenol, PNV All: progesterone, procardia, Cipro, pt reports poor tolerance of many other meds  PSH: hydrodistension of bladder.   PE: Filed Vitals:   05/05/12 1810  BP: 126/76  Pulse: 89  Temp:   Resp:    Gen: uncomfortable w/ ctx, no distress CV: RRR Pulm: CTAB Abd: gravid, no fundal tenderness LE: tr edema, NT GU: cvx 3/60%/ vtx -2/ post; recheck 1 hr later 4/80%/ vtx -2/ post Toco: q Fh: 140's, + accels, single late decel to 90's for 2 min, 10 beat variability   Prenatal Transfer Tool  Maternal Diabetes: No Genetic Screening: Normal Maternal Ultrasounds/Referrals: Normal Fetal Ultrasounds or other Referrals:  None Maternal Substance Abuse:  No Significant Maternal Medications:  None Significant Maternal Lab Results: None  A/P: 27 yo G2P0101 at 36 wks admitted in active labor - Labor. Expectant management. - FWB. Preterm labor. Reactive fetal testing, gestational age related risks d/w pt - GBS neg  Edwin Cherian A. 05/05/2012 8:05 PM

## 2012-05-05 NOTE — MAU Note (Signed)
Pt presents with complaints of contractions that started yesterday mainly in her back. States the contractions have gotten worse throughout the day. Denies any bleeding or LOF.

## 2012-05-05 NOTE — MAU Note (Signed)
Dr. Ernestina Penna notified of deceleration requested MD to review efm strip.

## 2012-05-06 MED ORDER — MORPHINE SULFATE 4 MG/ML IJ SOLN
5.0000 mg | Freq: Once | INTRAMUSCULAR | Status: AC
  Administered 2012-05-06: 5 mg via INTRAVENOUS
  Filled 2012-05-06 (×2): qty 1

## 2012-05-06 MED ORDER — TERBUTALINE SULFATE 1 MG/ML IJ SOLN
INTRAMUSCULAR | Status: AC
  Administered 2012-05-06: 1 mg
  Filled 2012-05-06: qty 1

## 2012-05-06 MED ORDER — MORPHINE SULFATE 4 MG/ML IJ SOLN
5.0000 mg | Freq: Once | INTRAMUSCULAR | Status: DC
  Filled 2012-05-06: qty 1

## 2012-05-06 MED ORDER — BUTORPHANOL TARTRATE 1 MG/ML IJ SOLN
1.0000 mg | Freq: Once | INTRAMUSCULAR | Status: AC
  Administered 2012-05-06: 1 mg via INTRAVENOUS
  Filled 2012-05-06: qty 1

## 2012-05-06 MED ORDER — ZOLPIDEM TARTRATE 5 MG PO TABS
10.0000 mg | ORAL_TABLET | Freq: Every evening | ORAL | Status: DC | PRN
  Administered 2012-05-06: 10 mg via ORAL
  Filled 2012-05-06: qty 2

## 2012-05-06 NOTE — Progress Notes (Signed)
S: Doing well, pt has finally slept and much improved pain control w/ morphine 5mg  IV, 5 mg SQ. Still feeling contractions but able to tolerate them much better. Pt notes some spotting with voiding.   O: BP 107/64  Pulse 101  Temp(Src) 98.1 F (36.7 C) (Oral)  Resp 20  Ht 5\' 1"  (1.549 m)  Wt 78.019 kg (172 lb)  BMI 32.52 kg/m2  SpO2 99%   FHT:  FHR: 140s bpm, variability: moderate,  accelerations:  Present,  decelerations:  Absent UC:   regular, every 5 minutes SVE:   Dilation: 4.5 Effacement (%): 80 Station: -2 Exam by:: dr. Ernestina Penna   A / P:  26 y.o.  Obstetric History   G2   P1   T0   P1   A0   TAB0   SAB0   E0   M0   L1    at [redacted]w[redacted]d prodromal latent phase, preterm labor, no tocolysis, no augmentation  Fetal Wellbeing:  Category I Pain Control:  has done well with morphine. now pain tolarable  Anticipated MOD:  anticipate SVD however pt preterm, not in active labor. no cervical change over 24 hrs, reactive fetal testing, improved pain control. d/c home now with labor precautions after theraputic rest and arrested PTL   Jess Sulak A. 05/06/2012, 4:48 PM

## 2012-05-06 NOTE — Progress Notes (Signed)
S: Doing well, no complaints, still contracting, able to rest but not sleep after 1mg  IV Stadol.   BP 92/45  Pulse 69  Temp(Src) 98.2 F (36.8 C) (Oral)  Resp 16  Ht 5\' 1"  (1.549 m)  Wt 78.019 kg (172 lb)  BMI 32.52 kg/m2   FHT:  FHR: 120s bpm, variability: moderate,  accelerations:  Present,  decelerations:  Absent UC:   regular, every 3 minutes SVE:   Dilation: 4.5 Effacement (%): 80 Station: -2;Ballotable Exam by:: Dr. Ernestina Penna   A / P:  26 y.o.  Obstetric History   G2   P1   T0   P1   A0   TAB0   SAB0   E0   M0   L1    at [redacted]w[redacted]d preterm labor, very slow cervical change since intial evaluation in MAU but no significant change in past 3 hrs. Preterm, Continue expectant management at this time.  Fetal Wellbeing:  Category I Pain Control:  IV pain meds for now, will want epidural when in active labor, still latent at this time  Anticipated MOD:  NSVD  Valentin Benney A. 05/06/2012, 12:02 AM

## 2012-05-06 NOTE — Progress Notes (Signed)
Dr Ernestina Penna notified of patient pain level, UCs, FHR tracing, SVE, and pt desire for additional pain meds. Reported pt fear of going home because of last delivery experience. Requesting POC for pt. Provider on way to assess.

## 2012-05-06 NOTE — Progress Notes (Signed)
Late entry. Pt last seen around 10am  S: Still uncomfortable with contractions, periods of rest after meds but painful contractions continue to return. Pt frustrated, uncomfortable, tearful.    O: BP 124/66  Pulse 136  Temp(Src) 97.5 F (36.4 C) (Axillary)  Resp 18  Ht 5\' 1"  (1.549 m)  Wt 78.019 kg (172 lb)  BMI 32.52 kg/m2   FHT:  FHR: 140's bpm, variability: moderate,  accelerations:  Present,  decelerations:  Absent UC:   regular, every 3 minutes SVE:   Dilation: 4.5 Effacement (%): 80 Station: -2 Exam by:: Tressia Danas RN   A / P:  26 y.o.  Obstetric History   G2   P1   T0   P1   A0   TAB0   SAB0   E0   M0   L1    at [redacted]w[redacted]d Protracted latent phase  Fetal Wellbeing:  Category I Pain Control:  At 10am visit d/w pt prodromal latent phase with uncomfortable contractions but given early GA not planning on augmentation.  We discussed option to try terbutaline as a 1 time dose. We discussed we are not attempting tocolyis with terbutaline (no indication for tocolysis at 36 wks and terbutaline no longer recommended for this) but instead trying to see if this would give her some relief. Pt expressed concern due to her many allergic reactions but decided to try. If this did not help her pain we would move on to additional narcotic. Pt had about 45 minutes relief with terbutaline and now asking for more pain meds. As Stadol has only given up to 1 hr releif, will try theraputic rest w/ morphine IV/SQ.   Anticipated MOD:  expect vaginal delivery but do not feel pt in active labor at this time. Pt and family state she is in too much pain to go home.   Larue Drawdy A. 05/06/2012 12:27 PM    Jaimee Corum A. 05/06/2012, 12:20 PM

## 2012-05-06 NOTE — Progress Notes (Signed)
S: Pt notes continued painful contractions. Pt's mother expressing concern over pt's level of pain. No LOF. Pt notes scant spotting. Received 1mg  Stadol which gave several hours of relief, then 10mg  Ambien and pt able to sleep for several hours, still drowsy but noting contractions worsening over the past few hrs  O: BP 95/62  Pulse 77  Temp(Src) 97.8 F (36.6 C) (Oral)  Resp 18  Ht 5\' 1"  (1.549 m)  Wt 78.019 kg (172 lb)  BMI 32.52 kg/m2   FHT:  FHR: 120s bpm, variability: moderate,  accelerations:  Present,  decelerations:  Absent UC:   regular, every 2 minutes SVE:   Dilation: 4.5 Effacement (%): 80 Station: -2 Exam by:: Aeson Sawyers MD   A / P:  26 y.o.  Obstetric History   G2   P1   T0   P1   A0   TAB0   SAB0   E0   M0   L1    at [redacted]w[redacted]d prodromal latent phase of preterm labor  Fetal Wellbeing:  Category II Pain Control:  as pt not in active labor and we are not yet comitted to delivery, urged against epidural. given discomfort will plan another 1 mg Stadol now.  Anticipated MOD:  vaginal, however, d/w pt lack of indication for augmentation due to early gestational age. reactive fetal testing and no signiificant change over the past 12+ hrs and encouraged pt to consider expectant management at home. will re-eval in another few hours  Schuyler Olden A. 05/06/2012, 6:58 AM

## 2012-05-07 ENCOUNTER — Encounter (HOSPITAL_COMMUNITY): Payer: Self-pay

## 2012-05-07 ENCOUNTER — Inpatient Hospital Stay (HOSPITAL_COMMUNITY)
Admission: AD | Admit: 2012-05-07 | Discharge: 2012-05-08 | DRG: 774 | Disposition: A | Discharge status: Home / Self Care | Payer: Managed Care, Other (non HMO) | Source: Ambulatory Visit | Attending: Obstetrics and Gynecology | Admitting: Obstetrics and Gynecology

## 2012-05-07 ENCOUNTER — Encounter (HOSPITAL_COMMUNITY): Payer: Self-pay | Admitting: Anesthesiology

## 2012-05-07 ENCOUNTER — Inpatient Hospital Stay (HOSPITAL_COMMUNITY): Payer: Managed Care, Other (non HMO) | Admitting: Anesthesiology

## 2012-05-07 DIAGNOSIS — D62 Acute posthemorrhagic anemia: Secondary | ICD-10-CM | POA: Diagnosis not present

## 2012-05-07 DIAGNOSIS — O9902 Anemia complicating childbirth: Principal | ICD-10-CM | POA: Diagnosis not present

## 2012-05-07 DIAGNOSIS — D500 Iron deficiency anemia secondary to blood loss (chronic): Secondary | ICD-10-CM

## 2012-05-07 DIAGNOSIS — R3989 Other symptoms and signs involving the genitourinary system: Secondary | ICD-10-CM

## 2012-05-07 HISTORY — DX: Iron deficiency anemia secondary to blood loss (chronic): D50.0

## 2012-05-07 LAB — CBC
HCT: 34.9 % — ABNORMAL LOW (ref 36.0–46.0)
Hemoglobin: 11.6 g/dL — ABNORMAL LOW (ref 12.0–15.0)
MCHC: 33.2 g/dL (ref 30.0–36.0)
RBC: 4.15 MIL/uL (ref 3.87–5.11)
WBC: 10 10*3/uL (ref 4.0–10.5)

## 2012-05-07 LAB — RPR: RPR Ser Ql: NONREACTIVE

## 2012-05-07 MED ORDER — ZOLPIDEM TARTRATE 5 MG PO TABS: 5.0000 mg | ORAL_TABLET | Freq: Every evening | ORAL | Status: DC | PRN

## 2012-05-07 MED ORDER — OXYTOCIN BOLUS FROM INFUSION
500.0000 mL | INTRAVENOUS | Status: DC
  Administered 2012-05-07: 500 mL via INTRAVENOUS

## 2012-05-07 MED ORDER — SIMETHICONE 80 MG PO CHEW: 80.0000 mg | CHEWABLE_TABLET | ORAL | Status: DC | PRN

## 2012-05-07 MED ORDER — FENTANYL 2.5 MCG/ML BUPIVACAINE 1/10 % EPIDURAL INFUSION (WH - ANES)
14.0000 mL/h | INTRAMUSCULAR | Status: DC | PRN
  Filled 2012-05-07: qty 125

## 2012-05-07 MED ORDER — WITCH HAZEL-GLYCERIN EX PADS: 1.0000 "application " | MEDICATED_PAD | CUTANEOUS | Status: DC | PRN

## 2012-05-07 MED ORDER — IBUPROFEN 600 MG PO TABS
600.0000 mg | ORAL_TABLET | Freq: Four times a day (QID) | ORAL | Status: DC
  Administered 2012-05-07 – 2012-05-08 (×4): 600 mg via ORAL
  Filled 2012-05-07 (×4): qty 1

## 2012-05-07 MED ORDER — ONDANSETRON HCL 4 MG/2ML IJ SOLN: 4.0000 mg | INTRAMUSCULAR | Status: DC | PRN

## 2012-05-07 MED ORDER — ACETAMINOPHEN 325 MG PO TABS: 650.0000 mg | ORAL_TABLET | ORAL | Status: DC | PRN

## 2012-05-07 MED ORDER — OXYCODONE-ACETAMINOPHEN 5-325 MG PO TABS: 1.0000 | ORAL_TABLET | ORAL | Status: DC | PRN

## 2012-05-07 MED ORDER — MISOPROSTOL 200 MCG PO TABS
800.0000 ug | ORAL_TABLET | Freq: Once | ORAL | Status: AC
  Administered 2012-05-07: 800 ug via RECTAL
  Filled 2012-05-07: qty 4

## 2012-05-07 MED ORDER — SENNOSIDES-DOCUSATE SODIUM 8.6-50 MG PO TABS
2.0000 | ORAL_TABLET | Freq: Every day | ORAL | Status: DC
  Administered 2012-05-07: 2 via ORAL

## 2012-05-07 MED ORDER — CITRIC ACID-SODIUM CITRATE 334-500 MG/5ML PO SOLN: 30.0000 mL | ORAL | Status: DC | PRN

## 2012-05-07 MED ORDER — LANOLIN HYDROUS EX OINT: TOPICAL_OINTMENT | CUTANEOUS | Status: DC | PRN

## 2012-05-07 MED ORDER — BENZOCAINE-MENTHOL 20-0.5 % EX AERO
1.0000 "application " | INHALATION_SPRAY | CUTANEOUS | Status: DC | PRN
  Administered 2012-05-08: 1 via TOPICAL
  Filled 2012-05-07 (×2): qty 56

## 2012-05-07 MED ORDER — IBUPROFEN 600 MG PO TABS
600.0000 mg | ORAL_TABLET | Freq: Four times a day (QID) | ORAL | Status: DC | PRN
  Administered 2012-05-07: 600 mg via ORAL
  Filled 2012-05-07: qty 1

## 2012-05-07 MED ORDER — LACTATED RINGERS IV SOLN: 500.0000 mL | INTRAVENOUS | Status: DC | PRN

## 2012-05-07 MED ORDER — MORPHINE SULFATE 4 MG/ML IJ SOLN
5.0000 mg | Freq: Once | INTRAMUSCULAR | Status: AC
  Administered 2012-05-07: 5 mg via INTRAVENOUS
  Filled 2012-05-07 (×2): qty 1

## 2012-05-07 MED ORDER — ONDANSETRON HCL 4 MG/2ML IJ SOLN
4.0000 mg | Freq: Four times a day (QID) | INTRAMUSCULAR | Status: DC | PRN
  Administered 2012-05-07: 4 mg via INTRAVENOUS
  Filled 2012-05-07: qty 2

## 2012-05-07 MED ORDER — OXYTOCIN 40 UNITS IN LACTATED RINGERS INFUSION - SIMPLE MED
62.5000 mL/h | INTRAVENOUS | Status: DC
  Filled 2012-05-07: qty 1000

## 2012-05-07 MED ORDER — DIBUCAINE 1 % RE OINT: 1.0000 "application " | TOPICAL_OINTMENT | RECTAL | Status: DC | PRN

## 2012-05-07 MED ORDER — DIPHENHYDRAMINE HCL 25 MG PO CAPS: 25.0000 mg | ORAL_CAPSULE | Freq: Four times a day (QID) | ORAL | Status: DC | PRN

## 2012-05-07 MED ORDER — FLEET ENEMA 7-19 GM/118ML RE ENEM: 1.0000 | ENEMA | RECTAL | Status: DC | PRN

## 2012-05-07 MED ORDER — EPHEDRINE 5 MG/ML INJ
10.0000 mg | INTRAVENOUS | Status: DC | PRN
  Filled 2012-05-07: qty 4
  Filled 2012-05-07: qty 2

## 2012-05-07 MED ORDER — FENTANYL 2.5 MCG/ML BUPIVACAINE 1/10 % EPIDURAL INFUSION (WH - ANES)
INTRAMUSCULAR | Status: DC | PRN
  Administered 2012-05-07: 14 mL/h via EPIDURAL

## 2012-05-07 MED ORDER — EPHEDRINE 5 MG/ML INJ
10.0000 mg | INTRAVENOUS | Status: DC | PRN
  Filled 2012-05-07: qty 2

## 2012-05-07 MED ORDER — PRENATAL MULTIVITAMIN CH
1.0000 | ORAL_TABLET | Freq: Every day | ORAL | Status: DC
  Administered 2012-05-07 – 2012-05-08 (×2): 1 via ORAL
  Filled 2012-05-07 (×2): qty 1

## 2012-05-07 MED ORDER — TETANUS-DIPHTH-ACELL PERTUSSIS 5-2.5-18.5 LF-MCG/0.5 IM SUSP: 0.5000 mL | Freq: Once | INTRAMUSCULAR | Status: DC

## 2012-05-07 MED ORDER — PHENYLEPHRINE 40 MCG/ML (10ML) SYRINGE FOR IV PUSH (FOR BLOOD PRESSURE SUPPORT)
80.0000 ug | PREFILLED_SYRINGE | INTRAVENOUS | Status: DC | PRN
  Filled 2012-05-07: qty 2
  Filled 2012-05-07: qty 5

## 2012-05-07 MED ORDER — METHYLERGONOVINE MALEATE 0.2 MG/ML IJ SOLN: 0.2000 mg | INTRAMUSCULAR | Status: DC | PRN

## 2012-05-07 MED ORDER — DIPHENHYDRAMINE HCL 50 MG/ML IJ SOLN: 12.5000 mg | INTRAMUSCULAR | Status: DC | PRN

## 2012-05-07 MED ORDER — LACTATED RINGERS IV SOLN
500.0000 mL | Freq: Once | INTRAVENOUS | Status: AC
  Administered 2012-05-07: 500 mL via INTRAVENOUS

## 2012-05-07 MED ORDER — LIDOCAINE HCL (PF) 1 % IJ SOLN
30.0000 mL | INTRAMUSCULAR | Status: DC | PRN
  Filled 2012-05-07 (×2): qty 30

## 2012-05-07 MED ORDER — PHENYLEPHRINE 40 MCG/ML (10ML) SYRINGE FOR IV PUSH (FOR BLOOD PRESSURE SUPPORT)
80.0000 ug | PREFILLED_SYRINGE | INTRAVENOUS | Status: DC | PRN
  Filled 2012-05-07: qty 2

## 2012-05-07 MED ORDER — OXYCODONE-ACETAMINOPHEN 5-325 MG PO TABS
1.0000 | ORAL_TABLET | ORAL | Status: DC | PRN
  Administered 2012-05-07 – 2012-05-08 (×6): 1 via ORAL
  Filled 2012-05-07 (×6): qty 1

## 2012-05-07 MED ORDER — METHYLERGONOVINE MALEATE 0.2 MG PO TABS: 0.2000 mg | ORAL_TABLET | ORAL | Status: DC | PRN

## 2012-05-07 MED ORDER — ONDANSETRON HCL 4 MG PO TABS: 4.0000 mg | ORAL_TABLET | ORAL | Status: DC | PRN

## 2012-05-07 MED ORDER — LIDOCAINE HCL (PF) 1 % IJ SOLN
INTRAMUSCULAR | Status: DC | PRN
  Administered 2012-05-07 (×2): 8 mL

## 2012-05-07 MED ORDER — LACTATED RINGERS IV SOLN
INTRAVENOUS | Status: DC
  Administered 2012-05-07 (×2): via INTRAVENOUS

## 2012-05-07 NOTE — Progress Notes (Signed)
Lactation at bedside assisting with breastfeeding. 

## 2012-05-07 NOTE — H&P (Signed)
CC: contractions and back pain  HPI: 27 yo G1P0101 with prior h/o PTL at 31 wks, was able to tocolyze for 3 days to get BMZ. No etiology of PTL. Prior SVD. Pt tried 17-P this preg, tried po prog, vag prog, procardia but side effects. Stopped Procardia 1 wk ago. Pt did BMX x 2 at 31 wks when she stopped 17-P and cervical change noted. Pt has been on bed rest. Admitted for therapeutic rest this past weekend with no cervical change x 24 hrs and dc'ed home. Returned with active contractions and "10/10" pain this am.  No ROM, no VB, good FH.   Planning epidural and SVD  PObstetric Hx: - GBS neg - s/p BMZ 3/11, 3/12 - h/o PTD, 31 wks, SVD 3'6 - 34 u/s: 5'3  PMH: IC, migraine  Meds: tylenol, PNV All: progesterone, procardia, Cipro, pt reports poor tolerance of many other meds  PSH: hydrodistension of bladder.   PE: Filed Vitals:   05/07/12 0840  BP: 112/81  Pulse: 95  Temp:   Resp:    Gen: uncomfortable w/ ctx, no distress CV: RRR Pulm: CTAB Abd: gravid, no fundal tenderness LE: tr edema, NT GU: cvx 5/80%/ vtx -2/ post per RN Toco: q Fh: 140's, + accels, 10 beat variability Category 1 tracing   Prenatal Transfer Tool  Maternal Diabetes: No Genetic Screening: Normal Maternal Ultrasounds/Referrals: Normal Fetal Ultrasounds or other Referrals:  None Maternal Substance Abuse:  No Significant Maternal Medications:  None Significant Maternal Lab Results: None  A/P: 27 yo G2P0101 at 36+ wks admitted in active vs prodromal labor - Labor. Expectant management. - FWB. Preterm labor. Reactive fetal testing, gestational age related risks d/w pt - GBS neg  Konnar Ben J 05/07/2012 9:07 AM

## 2012-05-07 NOTE — Progress Notes (Signed)
Melinda Munoz is a 27 y.o. G2P0101 at [redacted]w[redacted]d by LMP admitted for active labor  Subjective: Comfortable with epidural  Objective: BP 117/73  Pulse 90  Temp(Src) 98.4 F (36.9 C) (Oral)  Resp 18  Ht 5\' 1"  (1.549 m)  Wt 78.926 kg (174 lb)  BMI 32.89 kg/m2  SpO2 98%      FHT:  FHR: 145 bpm, variability: moderate,  accelerations:  Present,  decelerations:  Absent UC:   regular, every 3 minutes SVE:   Dilation: 7.5 Effacement (%): 100 Station: -1 Exam by:: Melinda Turvey, MD AROM-clear  Labs: Lab Results  Component Value Date   WBC 10.0 05/07/2012   HGB 11.6* 05/07/2012   HCT 34.9* 05/07/2012   MCV 84.1 05/07/2012   PLT 204 05/07/2012    Assessment / Plan: Spontaneous labor, progressing normally Preterm Labor  Labor: Progressing normally Preeclampsia:  na Fetal Wellbeing:  Category I Pain Control:  Epidural I/D:  n/a Anticipated MOD:  NSVD  Melinda Munoz 05/07/2012, 10:10 AM

## 2012-05-07 NOTE — Anesthesia Procedure Notes (Signed)
Epidural Patient location during procedure: OB Start time: 05/07/2012 9:42 AM End time: 05/07/2012 9:46 AM  Staffing Anesthesiologist: Sandrea Hughs Performed by: anesthesiologist   Preanesthetic Checklist Completed: patient identified, site marked, surgical consent, pre-op evaluation, timeout performed, IV checked, risks and benefits discussed and monitors and equipment checked  Epidural Patient position: sitting Prep: site prepped and draped and DuraPrep Patient monitoring: continuous pulse ox and blood pressure Approach: midline Injection technique: LOR air  Needle:  Needle type: Tuohy  Needle gauge: 17 G Needle length: 9 cm and 9 Needle insertion depth: 6 cm Catheter type: closed end flexible Catheter size: 19 Gauge Catheter at skin depth: 11 cm Test dose: negative and Other  Assessment Sensory level: T9 Events: blood not aspirated, injection not painful, no injection resistance, negative IV test and no paresthesia  Additional Notes Reason for block:procedure for pain

## 2012-05-07 NOTE — Anesthesia Preprocedure Evaluation (Signed)
Anesthesia Evaluation  Patient identified by MRN, date of birth, ID band Patient awake    Reviewed: Allergy & Precautions, H&P , NPO status , Patient's Chart, lab work & pertinent test results  Airway Mallampati: II TM Distance: >3 FB Neck ROM: full    Dental no notable dental hx.    Pulmonary neg pulmonary ROS,    Pulmonary exam normal       Cardiovascular negative cardio ROS      Neuro/Psych negative psych ROS   GI/Hepatic negative GI ROS, Neg liver ROS,   Endo/Other  negative endocrine ROS  Renal/GU negative Renal ROS  negative genitourinary   Musculoskeletal negative musculoskeletal ROS (+)   Abdominal Normal abdominal exam  (+)   Peds negative pediatric ROS (+)  Hematology negative hematology ROS (+)   Anesthesia Other Findings   Reproductive/Obstetrics (+) Pregnancy                           Anesthesia Physical Anesthesia Plan  ASA: II  Anesthesia Plan: Epidural   Post-op Pain Management:    Induction:   Airway Management Planned:   Additional Equipment:   Intra-op Plan:   Post-operative Plan:   Informed Consent: I have reviewed the patients History and Physical, chart, labs and discussed the procedure including the risks, benefits and alternatives for the proposed anesthesia with the patient or authorized representative who has indicated his/her understanding and acceptance.     Plan Discussed with:   Anesthesia Plan Comments:         Anesthesia Quick Evaluation  

## 2012-05-08 ENCOUNTER — Encounter (HOSPITAL_COMMUNITY): Payer: Self-pay | Admitting: Obstetrics and Gynecology

## 2012-05-08 DIAGNOSIS — D500 Iron deficiency anemia secondary to blood loss (chronic): Secondary | ICD-10-CM

## 2012-05-08 HISTORY — DX: Iron deficiency anemia secondary to blood loss (chronic): D50.0

## 2012-05-08 MED ORDER — POLYSACCHARIDE IRON COMPLEX 150 MG PO CAPS
150.0000 mg | ORAL_CAPSULE | Freq: Every day | ORAL | Status: DC
  Filled 2012-05-08 (×2): qty 1

## 2012-05-08 MED ORDER — SENNOSIDES-DOCUSATE SODIUM 8.6-50 MG PO TABS: ORAL_TABLET | ORAL | Status: DC

## 2012-05-08 MED ORDER — IBUPROFEN 600 MG PO TABS: 600.0000 mg | ORAL_TABLET | Freq: Four times a day (QID) | ORAL | Status: DC | PRN

## 2012-05-08 MED ORDER — POLYSACCHARIDE IRON COMPLEX 150 MG PO CAPS: 150.0000 mg | ORAL_CAPSULE | Freq: Every day | ORAL | Status: DC

## 2012-05-08 NOTE — Progress Notes (Signed)
05/08/12  1500  Mom complained of pain down the left side of her leg, questionably from Epidural, states she felt it run down her leg and now feels like her leg might give way with her. Anesthesia notified and will come to see pt.

## 2012-05-08 NOTE — Progress Notes (Signed)
Called to assess this patient prior to discharge. Patient complaining of numbness right hip area radiating down right lateral aspect of thigh. Hip hyperflexed during pushing. Appears to be neuropraxia from position rather than due to epidural, although she does report right leg was more "numb" during labor. Suspect she has meralgia paresthetica which should resolve in 7-10 days. Patient and family reassured. She denies bowel or bladder dysfunction. She does have some mild lower back pain. She was instructed to return to MAU or go to the ER if her back pain became severe, or she has worsening of her symptoms.

## 2012-05-08 NOTE — Discharge Summary (Signed)
Reviewed and agree with note and plan. PPH'hage from uterine atony last night improved with 800 mcg rectal Cytotec, feels better this morning, some cramping but bleeding minimal. Ok to D/c home, bleeding precaution, Anemia mngmt reviewed.  V.Akia Montalban, MD

## 2012-05-08 NOTE — Progress Notes (Signed)
Patient ID: Melinda Munoz, female   DOB: January 21, 1986, 27 y.o.   MRN: [id number] PPD # 1 G2 P 0 2 0 2 S/P SVD @ 36wks  Subjective: Pt reports feeling well/ Pain controlled with ibuprofen Tolerating po/ Voiding without problems/ No n/v Bleeding is light Newborn info:  Information for the patient's newborn:  Melinda Munoz, Boy Melinda Munoz [[id number]]  female  / circ planned for today/ Feeding: breast   Objective:  VS: Blood pressure 87/59, pulse 80, temperature 98.3 F (36.8 C), temperature source Oral, resp. rate 18.    Recent Labs  05/07/12 0800 05/08/12 0605  WBC 10.0 7.3  HGB 11.6* 8.6*  HCT 34.9* 25.9*  PLT 204 148*    Blood type: --/--/O POS, O POS (04/05 1857) Rubella: Immune (09/27 0000)    Physical Exam:  General: A & O x 3  alert, cooperative and no distress CV: Regular rate and rhythm Resp: clear Abdomen: soft, nontender, normal bowel sounds Uterine Fundus: firm, below umbilicus, nontender Perineum: intact, mild edema Lochia: minimal Ext: edema +1 and Homans sign is negative, no sign of DVT   A/P: PPD # 1/ G2P0202/ S/P: SVD @ 36wks/SROM ABL Anemia superimposed with chronic anemia; will start fe supplement today Doing well Continue routine post partum orders Anticipate D/C home in AM    Melinda Revel, MSN, [facility] 05/08/2012, 9:16 AM

## 2012-05-08 NOTE — Progress Notes (Signed)
Patient ID: Melinda Munoz, female   DOB: [birth year], 27 y.o.   MRN: [id number] PPD # 1  Subjective:  Pediatrician has cleared infant for d/c to home today.  Pt requests early d/c.   Objective:  VS: Blood pressure 87/59, pulse 80, temperature 98.3 F (36.8 C), temperature source Oral, resp. rate 18, height 5\' 1"  (1.549 m), weight 78.926 kg (174 lb), SpO2 99.00%, unknown if currently breastfeeding.    Recent Labs  05/07/12 0800 05/08/12 0605  WBC 10.0 7.3  HGB 11.6* 8.6*  HCT 34.9* 25.9*  PLT 204 148*    Blood type: --/--/O POS, O POS (04/05 1857) Rubella: Immune (09/27 0000)    Physical Exam:  General: A & O x 3   A/P: PPD # 1/ G2P0202/ S/P: SVD Doing well and stable for early d/c home Rx: Ibuprofen 600mg  po Q 6 hrs prn pain #30 Refill x 1 Senokot po BID prn constipation #30 Ref x 1 Rt pp visit in 6 wks.    Demetrius Revel, MSN, [facility] 05/08/2012, 10:57 AM

## 2012-05-08 NOTE — Discharge Summary (Signed)
Obstetric Discharge Summary Reason for Admission: G2 P 0 1 0 1 with onset of active labor at 36wks.  History previous PTB at 31 wks.  Has received BMX, 17P this pregnancy.  Intolerant of procardia Prenatal Procedures: NST and ultrasound Intrapartum Procedures: spontaneous vaginal delivery Postpartum Procedures: none Complications-Operative and Postpartum: none Hemoglobin  Date Value Range Status  05/08/2012 8.6* 12.0 - 15.0 g/dL Final     REPEATED TO VERIFY     DELTA CHECK NOTED     HCT  Date Value Range Status  05/08/2012 25.9* 36.0 - 46.0 % Final    Physical Exam:  General: alert, cooperative and no distress Lochia: appropriate Uterine Fundus: firm Incision: na DVT Evaluation: No evidence of DVT seen on physical exam.  Discharge Diagnoses: S/P SVD @ 36wks  Discharge Information: Date: 05/08/2012 Activity: pelvic rest Diet: routine Medications: PNV, Ibuprofen and iron and senokot Condition: stable Instructions: refer to practice specific booklet Discharge to: home Follow-up Information   Follow up with MODY,VAISHALI R, MD In 6 weeks.   Contact information:   [address] [place] [address] [phone]       Newborn Data: Live born female on [birth year] Birth Weight: 5 lb 9.4 oz (2535 g) APGAR: 9, 9  Home with mother.  Shomari Matusik K 05/08/2012, 11:06 AM

## 2012-05-08 NOTE — Anesthesia Postprocedure Evaluation (Signed)
Anesthesia Post Note  Patient: Melinda Munoz  Procedure(s) Performed: * No procedures listed *  Anesthesia type: Epidural  Patient location: Mother/Baby  Post pain: Pain level controlled  Post assessment: Post-op Vital signs reviewed  Last Vitals: BP 87/59  Pulse 80  Temp(Src) 36.8 C (Oral)  Resp 18  Ht 5\' 1"  (1.549 m)  Wt 174 lb (78.926 kg)  BMI 32.89 kg/m2  SpO2 99%  Post vital signs: Reviewed  Level of consciousness: awake  Complications: No apparent anesthesia complications

## 2012-05-08 NOTE — Lactation Note (Signed)
This note was copied from the chart of Boy Michalle Paulding. Lactation Consultation Note  Patient Name: Melinda Munoz Lever Today's Date: 05/08/2012 Reason for consult: Follow-up assessment   Maternal Data    Feeding   LATCH Score/Interventions Latch: Too sleepy or reluctant, no latch achieved, no sucking elicited.  Audible Swallowing: None  Type of Nipple: Everted at rest and after stimulation  Comfort (Breast/Nipple): Filling, red/small blisters or bruises, mild/mod discomfort  Problem noted: Mild/Moderate discomfort Interventions (Mild/moderate discomfort): Comfort gels  Hold (Positioning): No assistance needed to correctly position infant at breast.  LATCH Score: 5  Lactation Tools Discussed/Used     Consult Status Consult Status: Follow-up Date: 05/09/12 Follow-up type: Out-patient  Mom reports that her nipples are sore and she thinks baby is just on the tip of the nipple. Both nipples pink. Comfort gels given with instructions. Mom placed them on nipples now. Baby was circ'd this morning and is very sleepy. Attempted to nurse but he would not wake up. Mom for DC today. Encouraged to call for assist if baby wakes before DC. Op appointment made for tomorrow afternoon at 4 pm. No questions at present. Has Ameda pump for home use.   Pamelia Hoit 05/08/2012, 12:23 PM

## 2012-05-09 ENCOUNTER — Ambulatory Visit (HOSPITAL_COMMUNITY)
Admit: 2012-05-09 | Discharge: 2012-05-09 | Disposition: A | Discharge status: Home / Self Care | Payer: Managed Care, Other (non HMO) | Attending: Obstetrics & Gynecology | Admitting: Obstetrics & Gynecology

## 2012-05-09 LAB — CBC
HCT: 25.9 % — ABNORMAL LOW (ref 36.0–46.0)
Hemoglobin: 8.6 g/dL — ABNORMAL LOW (ref 12.0–15.0)
MCH: 28.1 pg (ref 26.0–34.0)
RBC: 3.06 MIL/uL — ABNORMAL LOW (ref 3.87–5.11)

## 2012-05-09 NOTE — Lactation Note (Addendum)
Adult Lactation Consultation Outpatient Visit Note  Patient Name: Melinda Munoz   Baby: Drue Second Cly Date of Birth: [birth year]    DOB: [birth year] Gestational Age at Delivery: [redacted]w[redacted]d   BW: 5# 9.4 oz Type of Delivery: Vag     D/c weight: 5# 7.1 oz (2.6%)        Today's weight: 5# 1.5oz (down 8.7%)  Breastfeeding History: Frequency of Breastfeeding: q 30 min-1 hour (last night) q3h (during the day) Length of Feeding: 10 min Voids: clear?, but not many voids since circ yesterday Stools: meconium "quite a few", getting easier to wipe  Supplementing / Method: Pumping:  Type of Pump:Ameda  Comments: Doesn't plan to use pump until returning to work (in about 4weeks)   Consultation Evaluation:  Initial Feeding Assessment: Pre-feed Weight: 2312g Post-feed [id number] Amount Transferred: 2 mL Comments: R breast, 25 min  Additional Feeding Assessment: Pre-feed [id number] Post-feed [id number] Amount Transferred:6 mL Comments:L breast, 1605, 16mm NS   Total Breast milk Transferred this Visit: 8mL Total Supplement Given: 14mL  Additional Interventions: Enfamil given via bottle.    Follow-Up Baby only took 2 mL at bare (R) breast.  A nipple shield (size 16*) was initiated on L breast to see if that would aid milk transfer.  Baby transferred 6 mL in a shorter period of time.  Supplementation initiated since baby is LPT, less than 6 lbs, and down 8.7% from BW.  Baby took 14mL of Enfamil via bottle.    The plan is for Mom to pump q3hrs x 15 min (she has a manual & electric); offer breast w/nipple shield, and then offer .5oz-1.0 oz via bottle after feed and see how much he takes.  Mom feels that baby fed better w/the nipple shield (she remarked it was the 1st time she had seen milk in the corners of his mouth). Mom taught how to pace bottle-feed.  Mom attentive to baby's feeding and satiety cues.    Mom has a f/u w/peds tomorrow.  She has a f/u appt w/lactation on [place], April 14th @ 9am  for continued evaluation of nipple shield use.  *A size 20 nipple shield may be a better fit for Mom.  However, there were none in supply.  Mom knows that if NS feels tight, then she can purchase a size 20.  Mom has a history of ample milk supply.  Her 1st child was born at 93 weeks and she pumped for him for 8 weeks. She reports being able to pump 8 oz in 15 min.  Lurline Hare [facility] 05/09/2012, 3:25 PM

## 2012-05-10 NOTE — Progress Notes (Signed)
Post discharge chart review completed.  

## 2012-05-10 NOTE — Discharge Summary (Signed)
Patient ID: Melinda Munoz MRN: [id number] DOB/AGE: 1985-10-03 26 y.o.  Admit date: 05/05/2012 Discharge date: 05/06/12  Admission Diagnoses: 36WKS CONTRACTIONS, preterm labor  Discharge Diagnoses: 36WKS CONTRACTIONS, arrested preterm labor         Discharged Condition: fair  Hospital Course: Patient admitted with painful contractions and slight cervical change at 36 weeks. Patient was admitted for probable labor. After admission however patient made very little further cervical change and stopped her cervical change at about 4 cm dilated 80% effaced. She continued to have painful contractions. Through her approximately 24 hour stay patient received for pain control 1 mg of IV Stadol then 10 mg by mouth Ambien 10 subcutaneous terbutaline and then morphine 5 mins I. the and 5 make subcutaneous. Only the latter combination medications actually allow the patient some rest. Patient to get therapeutic rest for several hours and was allowed to rest. When she awoke her contractions were less painful and patient was feeling better. Given no further cervical change over the prior 24 hours and the preterm status for which no augmentation was indicated the decision was made to discharge the patient to home. Strict labor precautions were discussed with the patient and patient and family agreed with discharge to home. Fetal testing remained reactive throughout.   Consults: None and MFM  Treatments: IV hydration, analgesia: Morphine and stadol  Disposition: home     Medication List    TAKE these medications       acetaminophen 325 MG tablet  Commonly known as:  TYLENOL  Take 650 mg by mouth daily as needed for pain (For headache.).     calcium carbonate 500 MG chewable tablet  Commonly known as:  TUMS - dosed in mg elemental calcium  Chew 2 tablets by mouth as needed for heartburn.     dimenhyDRINATE 50 MG tablet  Commonly known as:  DRAMAMINE  Take 50 mg by mouth daily as needed (For nausea.).      diphenhydrAMINE 25 mg capsule  Commonly known as:  BENADRYL  Take 25 mg by mouth every 6 (six) hours as needed for allergies.     prenatal multivitamin Tabs  Take 1 tablet by mouth daily at 12 noon.     progesterone 200 MG capsule  Commonly known as:  PROMETRIUM  Take 200 mg by mouth at bedtime.       pt was not instructed to take prometrium- this medication was already discontineud.   Signed: Lendon Colonel., MD MD 05/10/2012, 9:02 PM

## 2012-05-14 ENCOUNTER — Ambulatory Visit (HOSPITAL_COMMUNITY): Payer: Managed Care, Other (non HMO)

## 2013-01-20 ENCOUNTER — Emergency Department (HOSPITAL_BASED_OUTPATIENT_CLINIC_OR_DEPARTMENT_OTHER)
Admission: EM | Admit: 2013-01-20 | Discharge: 2013-01-20 | Disposition: A | Discharge status: Home / Self Care | Payer: Managed Care, Other (non HMO) | Attending: Emergency Medicine | Admitting: Emergency Medicine

## 2013-01-20 ENCOUNTER — Encounter (HOSPITAL_BASED_OUTPATIENT_CLINIC_OR_DEPARTMENT_OTHER): Payer: Self-pay | Admitting: Emergency Medicine

## 2013-01-20 DIAGNOSIS — Z87448 Personal history of other diseases of urinary system: Secondary | ICD-10-CM | POA: Insufficient documentation

## 2013-01-20 DIAGNOSIS — Z79899 Other long term (current) drug therapy: Secondary | ICD-10-CM | POA: Insufficient documentation

## 2013-01-20 DIAGNOSIS — Z87828 Personal history of other (healed) physical injury and trauma: Secondary | ICD-10-CM | POA: Insufficient documentation

## 2013-01-20 DIAGNOSIS — N949 Unspecified condition associated with female genital organs and menstrual cycle: Secondary | ICD-10-CM | POA: Insufficient documentation

## 2013-01-20 DIAGNOSIS — G8929 Other chronic pain: Secondary | ICD-10-CM | POA: Insufficient documentation

## 2013-01-20 DIAGNOSIS — H60399 Other infective otitis externa, unspecified ear: Secondary | ICD-10-CM | POA: Insufficient documentation

## 2013-01-20 DIAGNOSIS — H6091 Unspecified otitis externa, right ear: Secondary | ICD-10-CM

## 2013-01-20 DIAGNOSIS — D500 Iron deficiency anemia secondary to blood loss (chronic): Secondary | ICD-10-CM | POA: Insufficient documentation

## 2013-01-20 DIAGNOSIS — Z87891 Personal history of nicotine dependence: Secondary | ICD-10-CM | POA: Insufficient documentation

## 2013-01-20 MED ORDER — OXYCODONE-ACETAMINOPHEN 5-325 MG PO TABS: 1.0000 | ORAL_TABLET | ORAL | Status: DC | PRN

## 2013-01-20 MED ORDER — CLINDAMYCIN HCL 150 MG PO CAPS: 300.0000 mg | ORAL_CAPSULE | Freq: Three times a day (TID) | ORAL | Status: DC

## 2013-01-20 MED ORDER — IBUPROFEN 800 MG PO TABS: 800.0000 mg | ORAL_TABLET | Freq: Three times a day (TID) | ORAL | Status: DC | PRN

## 2013-01-20 MED ORDER — MORPHINE SULFATE 4 MG/ML IJ SOLN
4.0000 mg | Freq: Once | INTRAMUSCULAR | Status: AC
  Administered 2013-01-20: 4 mg via INTRAMUSCULAR
  Filled 2013-01-20: qty 1
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
## 2013-01-20 NOTE — ED Notes (Signed)
Right ear pain x 2 weeks, finished amoxicillin 3 days ago after 10 day treatment. Was just seen at [facility] today and given neomycin/hydrocortisone ear drops, pts states meds not helping. Pt denies fever.

## 2013-01-20 NOTE — ED Notes (Signed)
Patient asking for pain meds, notified nurse.

## 2013-01-23 ENCOUNTER — Emergency Department (HOSPITAL_BASED_OUTPATIENT_CLINIC_OR_DEPARTMENT_OTHER)
Admission: EM | Admit: 2013-01-23 | Discharge: 2013-01-23 | Disposition: A | Discharge status: Home / Self Care | Payer: Managed Care, Other (non HMO) | Attending: Emergency Medicine | Admitting: Emergency Medicine

## 2013-01-23 ENCOUNTER — Encounter (HOSPITAL_BASED_OUTPATIENT_CLINIC_OR_DEPARTMENT_OTHER): Payer: Self-pay | Admitting: Emergency Medicine

## 2013-01-23 DIAGNOSIS — Z79899 Other long term (current) drug therapy: Secondary | ICD-10-CM | POA: Insufficient documentation

## 2013-01-23 DIAGNOSIS — Z87820 Personal history of traumatic brain injury: Secondary | ICD-10-CM | POA: Insufficient documentation

## 2013-01-23 DIAGNOSIS — Z862 Personal history of diseases of the blood and blood-forming organs and certain disorders involving the immune mechanism: Secondary | ICD-10-CM | POA: Insufficient documentation

## 2013-01-23 DIAGNOSIS — Z87448 Personal history of other diseases of urinary system: Secondary | ICD-10-CM | POA: Insufficient documentation

## 2013-01-23 DIAGNOSIS — Z792 Long term (current) use of antibiotics: Secondary | ICD-10-CM | POA: Insufficient documentation

## 2013-01-23 DIAGNOSIS — H6091 Unspecified otitis externa, right ear: Secondary | ICD-10-CM

## 2013-01-23 DIAGNOSIS — H60399 Other infective otitis externa, unspecified ear: Secondary | ICD-10-CM | POA: Insufficient documentation

## 2013-01-23 DIAGNOSIS — Z87891 Personal history of nicotine dependence: Secondary | ICD-10-CM | POA: Insufficient documentation

## 2013-01-23 DIAGNOSIS — Z8742 Personal history of other diseases of the female genital tract: Secondary | ICD-10-CM | POA: Insufficient documentation

## 2013-01-23 MED ORDER — SULFAMETHOXAZOLE-TMP DS 800-160 MG PO TABS: 1.0000 | ORAL_TABLET | Freq: Two times a day (BID) | ORAL | Status: DC

## 2013-01-23 MED ORDER — LIDOCAINE HCL (PF) 1 % IJ SOLN
INTRAMUSCULAR | Status: AC
  Administered 2013-01-23: 13:00:00
  Filled 2013-01-23: qty 5

## 2013-01-23 MED ORDER — CEFTRIAXONE SODIUM 1 G IJ SOLR
1.0000 g | Freq: Once | INTRAMUSCULAR | Status: AC
Start: 2013-01-23 — End: 2013-01-23
  Administered 2013-01-23: 1 g via INTRAMUSCULAR
  Filled 2013-01-23: qty 10

## 2013-01-23 MED ORDER — OXYCODONE-ACETAMINOPHEN 5-325 MG PO TABS: 2.0000 | ORAL_TABLET | ORAL | Status: DC | PRN

## 2013-01-23 MED ORDER — DEXTROSE 5 % IV SOLN: 1.0000 g | INTRAVENOUS | Status: DC

## 2013-01-23 MED ORDER — CEPHALEXIN 500 MG PO CAPS: 500.0000 mg | ORAL_CAPSULE | Freq: Four times a day (QID) | ORAL | Status: DC

## 2013-01-23 MED ORDER — TOBRAMYCIN-DEXAMETHASONE 0.3-0.1 % OP SUSP: OPHTHALMIC | Status: DC

## 2013-01-23 NOTE — ED Notes (Addendum)
Patient here with ongoing right ear external and internal pain. Seen here Sunday for same and taking meds as prescribed. Reports pain is worse. Patient had percocet 2 hours pta
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
## 2013-01-23 NOTE — ED Provider Notes (Signed)
Medical screening examination/treatment/procedure(s) were performed by non-physician practitioner and as supervising physician I was immediately available for consultation/collaboration.  EKG Interpretation   None        Doug Sou, MD 01/23/13 1535

## 2013-12-02 ENCOUNTER — Encounter (HOSPITAL_BASED_OUTPATIENT_CLINIC_OR_DEPARTMENT_OTHER): Payer: Self-pay | Admitting: Emergency Medicine

## 2015-10-08 ENCOUNTER — Emergency Department (HOSPITAL_BASED_OUTPATIENT_CLINIC_OR_DEPARTMENT_OTHER): Payer: 59

## 2015-10-08 ENCOUNTER — Encounter (HOSPITAL_BASED_OUTPATIENT_CLINIC_OR_DEPARTMENT_OTHER): Payer: Self-pay | Admitting: *Deleted

## 2015-10-08 ENCOUNTER — Emergency Department (HOSPITAL_BASED_OUTPATIENT_CLINIC_OR_DEPARTMENT_OTHER)
Admission: EM | Admit: 2015-10-08 | Discharge: 2015-10-08 | Disposition: A | Discharge status: Home / Self Care | Payer: 59 | Attending: Emergency Medicine | Admitting: Emergency Medicine

## 2015-10-08 DIAGNOSIS — R102 Pelvic and perineal pain: Secondary | ICD-10-CM | POA: Insufficient documentation

## 2015-10-08 DIAGNOSIS — Z87891 Personal history of nicotine dependence: Secondary | ICD-10-CM | POA: Insufficient documentation

## 2015-10-08 LAB — URINALYSIS, ROUTINE W REFLEX MICROSCOPIC
BILIRUBIN URINE: NEGATIVE
Glucose, UA: NEGATIVE mg/dL
Hgb urine dipstick: NEGATIVE
Ketones, ur: NEGATIVE mg/dL
LEUKOCYTES UA: NEGATIVE
NITRITE: NEGATIVE
Protein, ur: NEGATIVE mg/dL
SPECIFIC GRAVITY, URINE: 1.014 (ref 1.005–1.030)
pH: 7.5 (ref 5.0–8.0)

## 2015-10-08 LAB — CBC
HEMATOCRIT: 41.9 % (ref 36.0–46.0)
Hemoglobin: 14.1 g/dL (ref 12.0–15.0)
MCH: 29.2 pg (ref 26.0–34.0)
MCHC: 33.7 g/dL (ref 30.0–36.0)
MCV: 86.7 fL (ref 78.0–100.0)
Platelets: 279 10*3/uL (ref 150–400)
RBC: 4.83 MIL/uL (ref 3.87–5.11)
RDW: 13.2 % (ref 11.5–15.5)
WBC: 10.1 10*3/uL (ref 4.0–10.5)

## 2015-10-08 LAB — WET PREP, GENITAL
Clue Cells Wet Prep HPF POC: NONE SEEN
SPERM: NONE SEEN
Trich, Wet Prep: NONE SEEN
Yeast Wet Prep HPF POC: NONE SEEN

## 2015-10-08 LAB — COMPREHENSIVE METABOLIC PANEL
ALBUMIN: 4.3 g/dL (ref 3.5–5.0)
ALT: 14 U/L (ref 14–54)
AST: 23 U/L (ref 15–41)
Alkaline Phosphatase: 79 U/L (ref 38–126)
Anion gap: 10 (ref 5–15)
BILIRUBIN TOTAL: 0.5 mg/dL (ref 0.3–1.2)
BUN: 9 mg/dL (ref 6–20)
CHLORIDE: 101 mmol/L (ref 101–111)
CO2: 27 mmol/L (ref 22–32)
Calcium: 9.2 mg/dL (ref 8.9–10.3)
Creatinine, Ser: 0.72 mg/dL (ref 0.44–1.00)
GFR calc Af Amer: >60 mL/min (ref >60)
GFR calc non Af Amer: >60 mL/min (ref >60)
GLUCOSE: 90 mg/dL (ref 65–99)
POTASSIUM: 4.2 mmol/L (ref 3.5–5.1)
Sodium: 138 mmol/L (ref 135–145)
Total Protein: 7.6 g/dL (ref 6.5–8.1)

## 2015-10-08 LAB — PREGNANCY, URINE: PREG TEST UR: NEGATIVE

## 2015-10-08 MED ORDER — ONDANSETRON HCL 8 MG PO TABS: 8.0000 mg | ORAL_TABLET | Freq: Once | ORAL | Status: DC

## 2015-10-08 MED ORDER — PROMETHAZINE HCL 25 MG/ML IJ SOLN
12.5000 mg | Freq: Once | INTRAMUSCULAR | Status: AC
  Administered 2015-10-08: 12.5 mg via INTRAVENOUS
  Filled 2015-10-08: qty 1

## 2015-10-08 MED ORDER — MORPHINE SULFATE (PF) 4 MG/ML IV SOLN
4.0000 mg | Freq: Once | INTRAVENOUS | Status: AC
  Administered 2015-10-08: 4 mg via INTRAVENOUS
  Filled 2015-10-08: qty 1

## 2015-10-08 MED ORDER — LIDOCAINE-EPINEPHRINE (PF) 2 %-1:200000 IJ SOLN: 10.0000 mL | Freq: Once | INTRAMUSCULAR | Status: DC

## 2015-10-08 MED ORDER — ONDANSETRON HCL 4 MG/2ML IJ SOLN
4.0000 mg | Freq: Once | INTRAMUSCULAR | Status: AC
  Administered 2015-10-08: 4 mg via INTRAVENOUS
  Filled 2015-10-08: qty 2

## 2015-10-08 MED ORDER — TRAMADOL HCL 50 MG PO TABS: 50.0000 mg | ORAL_TABLET | Freq: Four times a day (QID) | ORAL | 0 refills | Status: DC | PRN

## 2015-10-08 NOTE — ED Notes (Signed)
Pt verbalizes understanding of d/c instructions and denies any further needs at this time. 
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
## 2015-10-08 NOTE — ED Triage Notes (Signed)
Lower left abdominal pain. Hx of cyst. He had a exploratory lab to r/o endometriosis and none was found. She was referred to see a GI doctor but has not been able to see one yet.

## 2015-10-08 NOTE — Discharge Instructions (Signed)
You can use Ibuprofen for pain control. If you have break through pain you can use Tramadol. Please follow with OBGYN, ultrasound positive for fibroids could be the source of your pain and abnormal menstrual bleeding.

## 2015-10-08 NOTE — ED Notes (Signed)
Patient reports waking up this morning with nausea. Severe pain began approx 3hrs ago in left lower quadrant radiating towards back.  Took 800mg  Ibuprofen without relief.  Patient reports hx of ruptured ovarian cyst.

## 2015-10-09 LAB — GC/CHLAMYDIA PROBE AMP (~~LOC~~) NOT AT ARMC
CHLAMYDIA, DNA PROBE: NEGATIVE
Neisseria Gonorrhea: NEGATIVE

## 2016-09-17 ENCOUNTER — Emergency Department (HOSPITAL_COMMUNITY)
Admission: EM | Admit: 2016-09-17 | Discharge: 2016-09-17 | Disposition: A | Discharge status: Home / Self Care | Payer: 59 | Attending: Emergency Medicine | Admitting: Emergency Medicine

## 2016-09-17 ENCOUNTER — Emergency Department (HOSPITAL_COMMUNITY): Payer: 59

## 2016-09-17 ENCOUNTER — Encounter (HOSPITAL_COMMUNITY): Payer: Self-pay

## 2016-09-17 DIAGNOSIS — R1011 Right upper quadrant pain: Secondary | ICD-10-CM | POA: Diagnosis not present

## 2016-09-17 DIAGNOSIS — Z87891 Personal history of nicotine dependence: Secondary | ICD-10-CM | POA: Diagnosis not present

## 2016-09-17 DIAGNOSIS — R110 Nausea: Secondary | ICD-10-CM | POA: Insufficient documentation

## 2016-09-17 DIAGNOSIS — K529 Noninfective gastroenteritis and colitis, unspecified: Secondary | ICD-10-CM | POA: Diagnosis not present

## 2016-09-17 DIAGNOSIS — R197 Diarrhea, unspecified: Secondary | ICD-10-CM | POA: Insufficient documentation

## 2016-09-17 DIAGNOSIS — R1031 Right lower quadrant pain: Secondary | ICD-10-CM | POA: Diagnosis present

## 2016-09-17 LAB — CBC WITH DIFFERENTIAL/PLATELET
Basophils Absolute: 0 10*3/uL (ref 0.0–0.1)
Basophils Relative: 0 %
EOS ABS: 0.1 10*3/uL (ref 0.0–0.7)
Eosinophils Relative: 1 %
HCT: 41.2 % (ref 36.0–46.0)
HEMOGLOBIN: 13.9 g/dL (ref 12.0–15.0)
LYMPHS ABS: 3.6 10*3/uL (ref 0.7–4.0)
LYMPHS PCT: 28 %
MCH: 29 pg (ref 26.0–34.0)
MCHC: 33.7 g/dL (ref 30.0–36.0)
MCV: 86 fL (ref 78.0–100.0)
MONOS PCT: 7 %
Monocytes Absolute: 0.9 10*3/uL (ref 0.1–1.0)
NEUTROS PCT: 64 %
Neutro Abs: 8.4 10*3/uL — ABNORMAL HIGH (ref 1.7–7.7)
Platelets: 247 10*3/uL (ref 150–400)
RBC: 4.79 MIL/uL (ref 3.87–5.11)
RDW: 13.8 % (ref 11.5–15.5)
WBC: 13.1 10*3/uL — ABNORMAL HIGH (ref 4.0–10.5)

## 2016-09-17 LAB — COMPREHENSIVE METABOLIC PANEL
ALT: 23 U/L (ref 14–54)
AST: 15 U/L (ref 15–41)
Albumin: 3.8 g/dL (ref 3.5–5.0)
Alkaline Phosphatase: 61 U/L (ref 38–126)
Anion gap: 8 (ref 5–15)
BUN: 12 mg/dL (ref 6–20)
CHLORIDE: 103 mmol/L (ref 101–111)
CO2: 28 mmol/L (ref 22–32)
CREATININE: 0.76 mg/dL (ref 0.44–1.00)
Calcium: 8.3 mg/dL — ABNORMAL LOW (ref 8.9–10.3)
GFR calc non Af Amer: >60 mL/min (ref >60)
Glucose, Bld: 90 mg/dL (ref 65–99)
POTASSIUM: 3.7 mmol/L (ref 3.5–5.1)
SODIUM: 139 mmol/L (ref 135–145)
Total Bilirubin: 0.7 mg/dL (ref 0.3–1.2)
Total Protein: 6.4 g/dL — ABNORMAL LOW (ref 6.5–8.1)

## 2016-09-17 LAB — URINALYSIS, ROUTINE W REFLEX MICROSCOPIC
BILIRUBIN URINE: NEGATIVE
Glucose, UA: NEGATIVE mg/dL
Ketones, ur: NEGATIVE mg/dL
Leukocytes, UA: NEGATIVE
Nitrite: NEGATIVE
Protein, ur: NEGATIVE mg/dL
Specific Gravity, Urine: 1.013 (ref 1.005–1.030)
pH: 5 (ref 5.0–8.0)

## 2016-09-17 LAB — POC URINE PREG, ED: PREG TEST UR: NEGATIVE

## 2016-09-17 MED ORDER — DICYCLOMINE HCL 20 MG PO TABS: 20.0000 mg | ORAL_TABLET | Freq: Two times a day (BID) | ORAL | 0 refills | Status: DC | PRN

## 2016-09-17 MED ORDER — SODIUM CHLORIDE 0.9 % IV BOLUS (SEPSIS)
1000.0000 mL | Freq: Once | INTRAVENOUS | Status: AC
  Administered 2016-09-17: 1000 mL via INTRAVENOUS

## 2016-09-17 MED ORDER — ONDANSETRON HCL 4 MG/2ML IJ SOLN
4.0000 mg | Freq: Once | INTRAMUSCULAR | Status: AC
  Administered 2016-09-17: 4 mg via INTRAVENOUS
  Filled 2016-09-17: qty 2

## 2016-09-17 MED ORDER — ONDANSETRON 4 MG PO TBDP
4.0000 mg | ORAL_TABLET | Freq: Three times a day (TID) | ORAL | 0 refills | Status: DC | PRN
Start: 2016-09-17 — End: 2017-09-14

## 2016-09-17 MED ORDER — DICYCLOMINE HCL 10 MG/ML IM SOLN
20.0000 mg | Freq: Once | INTRAMUSCULAR | Status: AC
  Administered 2016-09-17: 20 mg via INTRAMUSCULAR
  Filled 2016-09-17: qty 2

## 2016-09-17 MED ORDER — KETOROLAC TROMETHAMINE 30 MG/ML IJ SOLN
30.0000 mg | Freq: Once | INTRAMUSCULAR | Status: AC
  Administered 2016-09-17: 30 mg via INTRAVENOUS
  Filled 2016-09-17: qty 1

## 2016-09-17 NOTE — ED Triage Notes (Signed)
Patient c/o right flank pain x 3 days. Patient reports history of Interstitial cystitis

## 2016-09-17 NOTE — ED Notes (Signed)
RN to draw labs. 
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
## 2017-09-14 ENCOUNTER — Encounter: Payer: Self-pay | Admitting: Family Medicine

## 2017-09-14 ENCOUNTER — Ambulatory Visit (INDEPENDENT_AMBULATORY_CARE_PROVIDER_SITE_OTHER): Payer: PRIVATE HEALTH INSURANCE | Admitting: Family Medicine

## 2017-09-14 VITALS — BP 120/80 | HR 118 | Temp 98.1°F | Ht 61.0 in | Wt 144.4 lb

## 2017-09-14 DIAGNOSIS — K645 Perianal venous thrombosis: Secondary | ICD-10-CM | POA: Insufficient documentation

## 2017-09-14 DIAGNOSIS — R634 Abnormal weight loss: Secondary | ICD-10-CM | POA: Insufficient documentation

## 2017-09-14 DIAGNOSIS — R1030 Lower abdominal pain, unspecified: Secondary | ICD-10-CM | POA: Diagnosis not present

## 2017-09-14 DIAGNOSIS — R1310 Dysphagia, unspecified: Secondary | ICD-10-CM

## 2017-09-14 MED ORDER — HYDROCORTISONE 2.5 % RE CREA: 1.0000 "application " | TOPICAL_CREAM | Freq: Two times a day (BID) | RECTAL | 0 refills | Status: DC

## 2017-09-14 NOTE — Progress Notes (Signed)
Pre visit review using our clinic review tool, if applicable. No additional management support is needed unless otherwise documented below in the visit note. 
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
## 2017-09-14 NOTE — Patient Instructions (Addendum)
If you do not hear anything about your referral in the next 1-2 weeks, call our office and ask for an update.  Give us 2-3 business days to get the results of your labs back.   Try Metamucil to help add bulk to stool. This could help with loose nature of stool and perhaps pain as well.  Stay hydrated.  Let us know if you need anything.

## 2017-09-15 ENCOUNTER — Encounter: Payer: Self-pay | Admitting: Physician Assistant

## 2017-09-15 ENCOUNTER — Encounter: Payer: Self-pay | Admitting: Family Medicine

## 2017-09-15 LAB — CBC WITH DIFFERENTIAL/PLATELET
Basophils Absolute: 0 10*3/uL (ref 0.0–0.1)
Basophils Relative: 0.5 % (ref 0.0–3.0)
EOS PCT: 0.3 % (ref 0.0–5.0)
Eosinophils Absolute: 0 10*3/uL (ref 0.0–0.7)
HEMATOCRIT: 41.1 % (ref 36.0–46.0)
HEMOGLOBIN: 13.6 g/dL (ref 12.0–15.0)
LYMPHS PCT: 20.6 % (ref 12.0–46.0)
Lymphs Abs: 1.5 10*3/uL (ref 0.7–4.0)
MCHC: 33.1 g/dL (ref 30.0–36.0)
MCV: 88.4 fl (ref 78.0–100.0)
MONO ABS: 0.4 10*3/uL (ref 0.1–1.0)
Monocytes Relative: 5.4 % (ref 3.0–12.0)
Neutro Abs: 5.4 10*3/uL (ref 1.4–7.7)
Neutrophils Relative %: 73.2 % (ref 43.0–77.0)
Platelets: 234 10*3/uL (ref 150.0–400.0)
RBC: 4.65 Mil/uL (ref 3.87–5.11)
RDW: 13.8 % (ref 11.5–15.5)
WBC: 7.4 10*3/uL (ref 4.0–10.5)

## 2017-09-15 LAB — COMPREHENSIVE METABOLIC PANEL
ALBUMIN: 4.6 g/dL (ref 3.5–5.2)
ALT: 8 U/L (ref 0–35)
AST: 11 U/L (ref 0–37)
Alkaline Phosphatase: 52 U/L (ref 39–117)
BUN: 9 mg/dL (ref 6–23)
CO2: 30 mEq/L (ref 19–32)
Calcium: 9.9 mg/dL (ref 8.4–10.5)
Chloride: 102 mEq/L (ref 96–112)
Creatinine, Ser: 0.79 mg/dL (ref 0.40–1.20)
GFR: 90.25 mL/min (ref >60.00)
Glucose, Bld: 99 mg/dL (ref 70–99)
Potassium: 4.3 mEq/L (ref 3.5–5.1)
SODIUM: 141 meq/L (ref 135–145)
Total Bilirubin: 0.8 mg/dL (ref 0.2–1.2)
Total Protein: 6.9 g/dL (ref 6.0–8.3)

## 2017-09-15 LAB — T4, FREE: Free T4: 0.94 ng/dL (ref 0.60–1.60)

## 2017-09-15 LAB — TSH: TSH: 1.96 u[IU]/mL (ref 0.35–4.50)

## 2017-10-09 ENCOUNTER — Ambulatory Visit (INDEPENDENT_AMBULATORY_CARE_PROVIDER_SITE_OTHER): Payer: PRIVATE HEALTH INSURANCE | Admitting: Physician Assistant

## 2017-10-09 ENCOUNTER — Encounter: Payer: Self-pay | Admitting: Physician Assistant

## 2017-10-09 VITALS — BP 110/80 | HR 76 | Ht 61.12 in | Wt 145.0 lb

## 2017-10-09 DIAGNOSIS — R1310 Dysphagia, unspecified: Secondary | ICD-10-CM | POA: Diagnosis not present

## 2017-10-09 DIAGNOSIS — R112 Nausea with vomiting, unspecified: Secondary | ICD-10-CM

## 2017-10-09 DIAGNOSIS — R194 Change in bowel habit: Secondary | ICD-10-CM

## 2017-10-09 DIAGNOSIS — R634 Abnormal weight loss: Secondary | ICD-10-CM | POA: Diagnosis not present

## 2017-10-09 DIAGNOSIS — R1084 Generalized abdominal pain: Secondary | ICD-10-CM | POA: Diagnosis not present

## 2017-10-09 MED ORDER — NA SULFATE-K SULFATE-MG SULF 17.5-3.13-1.6 GM/177ML PO SOLN: 1.0000 | ORAL | 0 refills | Status: DC
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
## 2017-10-09 NOTE — Patient Instructions (Signed)

## 2017-10-23 NOTE — Progress Notes (Signed)
I have reviewed the management plan. I agree with this plan.

## 2017-10-24 ENCOUNTER — Ambulatory Visit (AMBULATORY_SURGERY_CENTER): Payer: PRIVATE HEALTH INSURANCE | Admitting: Gastroenterology

## 2017-10-24 ENCOUNTER — Encounter: Payer: Self-pay | Admitting: Gastroenterology

## 2017-10-24 VITALS — BP 120/90 | HR 72 | Temp 99.1°F | Resp 18 | Ht 61.0 in | Wt 135.0 lb

## 2017-10-24 DIAGNOSIS — R634 Abnormal weight loss: Secondary | ICD-10-CM | POA: Diagnosis present

## 2017-10-24 DIAGNOSIS — R194 Change in bowel habit: Secondary | ICD-10-CM | POA: Diagnosis not present

## 2017-10-24 DIAGNOSIS — K2970 Gastritis, unspecified, without bleeding: Secondary | ICD-10-CM | POA: Diagnosis not present

## 2017-10-24 MED ORDER — SODIUM CHLORIDE 0.9 % IV SOLN
500.0000 mL | Freq: Once | INTRAVENOUS | Status: DC
Start: 2017-10-24 — End: 2017-11-10

## 2017-10-24 MED ORDER — SODIUM CHLORIDE 0.9 % IV SOLN
4.0000 mg | Freq: Once | INTRAVENOUS | Status: AC
  Administered 2017-10-24: 4 mg via INTRAVENOUS

## 2017-10-24 MED ORDER — SODIUM CHLORIDE 0.9 % IV SOLN: 500.0000 mL | Freq: Once | INTRAVENOUS | Status: DC

## 2017-10-24 NOTE — Progress Notes (Signed)
Pt abdomen is soft and she is passing flatus.  Pt became tearful when waking up and c/o nausea and abdominal cramping.  Dr. Orvan FalconerBeavers and I encouraged pt to pass gas and pt was given 1539  zofran 4 mg IV by Charlsie Merlesorey Wall, CRNA.  Pt was repositioned to hand knee position.  Pt did not expell gas.  Pt was taken to the restroom and she did pass more gas sitting on the toilet.  I asked pt if she would like to sit on the toilet a few more minutes and she said "yes".  Call bell was placed in her hand.  I told her I will go over discharge instructions with her husband. maw

## 2017-10-24 NOTE — Patient Instructions (Signed)
YOU HAD AN ENDOSCOPIC PROCEDURE TODAY AT [facility]:   Refer to the procedure report that was given to you for any specific questions about what was found during the examination.  If the procedure report does not answer your questions, please call your gastroenterologist to clarify.  If you requested that your care partner not be given the details of your procedure findings, then the procedure report has been included in a sealed envelope for you to review at your convenience later.  YOU SHOULD EXPECT: Some feelings of bloating in the abdomen. Passage of more gas than usual.  Walking can help get rid of the air that was put into your GI tract during the procedure and reduce the bloating. If you had a lower endoscopy (such as a colonoscopy or flexible sigmoidoscopy) you may notice spotting of blood in your stool or on the toilet paper. If you underwent a bowel prep for your procedure, you may not have a normal bowel movement for a few days.  Please Note:  You might notice some irritation and congestion in your nose or some drainage.  This is from the oxygen used during your procedure.  There is no need for concern and it should clear up in a day or so.  SYMPTOMS TO REPORT IMMEDIATELY:   Following lower endoscopy (colonoscopy or flexible sigmoidoscopy):  Excessive amounts of blood in the stool  Significant tenderness or worsening of abdominal pains  Swelling of the abdomen that is new, acute  Fever of 100F or higher   Following upper endoscopy (EGD)  Vomiting of blood or coffee ground material  New chest pain or pain under the shoulder blades  Painful or persistently difficult swallowing  New shortness of breath  Fever of 100F or higher  Black, tarry-looking stools  For urgent or emergent issues, a gastroenterologist can be reached at any hour by calling [phone].   DIET:  We do recommend a small meal at first, but then you may proceed to your regular diet.  Drink  plenty of fluids but you should avoid alcoholic beverages for 24 hours.  ACTIVITY:  You should plan to take it easy for the rest of today and you should NOT DRIVE or use heavy machinery until tomorrow (because of the sedation medicines used during the test).    FOLLOW UP: Our staff will call the number listed on your records the next business day following your procedure to check on you and address any questions or concerns that you may have regarding the information given to you following your procedure. If we do not reach you, we will leave a message.  However, if you are feeling well and you are not experiencing any problems, there is no need to return our call.  We will assume that you have returned to your regular daily activities without incident.  If any biopsies were taken you will be contacted by phone or by letter within the next 1-3 weeks.  Please call us at [phone] if you have not heard about the biopsies in 3 weeks.    SIGNATURES/CONFIDENTIALITY: You and/or your care partner have signed paperwork which will be entered into your electronic medical record.  These signatures attest to the fact that that the information above on your After Visit Summary has been reviewed and is understood.  Full responsibility of the confidentiality of this discharge information lies with you and/or your care-partner.     Handouts were given to your care partner on  gastritis,  NO ASPIRIN, ASPIRIN CONTAINING PRODUCTS (BC OR GOODY POWDERS) OR NSAIDS (IBUPROFEN, ADVIL, ALEVE, AND MOTRIN) FOR; TYLENOL IS OK TO TAKE if needed. You may resume your other current medications today. Await biopsy results. Please call if any questions or concerns.

## 2017-10-24 NOTE — Progress Notes (Signed)
Report given to PACU, vss 

## 2017-10-24 NOTE — Progress Notes (Signed)
Pt is feeling much better.  Abdomin continues to be soft to touch.  Passing a good amount of gas.  Pt was given ice chips and she is ready for discharge.

## 2017-10-24 NOTE — Op Note (Signed)
[facility] Patient Name: Cosette Char Procedure Date: 10/24/2017 2:50 PM MRN: [id number] Endoscopist: Tressia Danas MD, MD Age: 32 Referring MD:  Date of Birth: 1985-03-11 Gender: Female Account #: [id number] Procedure:                Colonoscopy Indications:              Change in bowel habits, Weight loss (50 pounds                            unintentionally). Please see 10/09/17 outpatient GI                            consultation note for complete details. Medicines:                See the Anesthesia note for documentation of the                            administered medications Procedure:                Pre-Anesthesia Assessment:                           - Prior to the procedure, a History and Physical                            was performed, and patient medications and                            allergies were reviewed. The patient's tolerance of                            previous anesthesia was also reviewed. The risks                            and benefits of the procedure and the sedation                            options and risks were discussed with the patient.                            All questions were answered, and informed consent                            was obtained. Prior Anticoagulants: The patient has                            taken no previous anticoagulant or antiplatelet                            agents. ASA Grade Assessment: I - A normal, healthy                            patient. After reviewing the risks and benefits,  the patient was deemed in satisfactory condition to                            undergo the procedure.                           After obtaining informed consent, the colonoscope                            was passed under direct vision. Throughout the                            procedure, the patient's blood pressure, pulse, and                            oxygen saturations were monitored  continuously. The                            Colonoscope was introduced through the anus and                            advanced to the the terminal ileum, with                            identification of the appendiceal orifice and IC                            valve. Approximately 10 cm of distal terminal ileum                            was examined. The colonoscopy was performed without                            difficulty. The patient tolerated the procedure                            well. The quality of the bowel preparation was good. Scope In: 3:03:07 PM Scope Out: 3:15:08 PM Scope Withdrawal Time: 0 hours 8 minutes 49 seconds  Total Procedure Duration: 0 hours 12 minutes 1 second  Findings:                 The perianal and digital rectal examinations were                            normal.                           The colon (entire examined portion) appeared                            normal. Biopsies were taken with a cold forceps for                            histology. Estimated blood loss: none.  The distal ileum contained a single non-bleeding                            aphtha. Stigmata of recent bleeding were present.                            This was biopsied with a cold forceps for histology.                           The exam was otherwise without abnormality on                            direct and retroflexion views. Complications:            No immediate complications. Impression:               - The entire examined colon is normal. Biopsied.                           - Aphtha in the distal ileum. Biopsied. The                            remainder of the examined terminal ileum appeared                            normal.                           - The examination was otherwise normal on direct                            and retroflexion views. Recommendation:           - Discharge patient to home.                           - Resume  previous diet.                           - Continue present medications.                           - Await pathology results.                           - Avoid all NSAIDs.                           - Return to the GI Clinic [facility] in 2-4 weeks. Tressia DanasKimberly Madoline Bhatt MD, MD 10/24/2017 3:30:25 PM This report has been signed electronically.

## 2017-10-24 NOTE — Op Note (Signed)
[facility] Patient Name: Melinda Munoz Procedure Date: 10/24/2017 2:50 PM MRN: [id number] Endoscopist: Tressia DanasKimberly Remmington Teters MD, MD Age: [age] Referring MD:  Date of Birth: 01/13/1986 Gender: Female Account #: [id number] Procedure:                Upper GI endoscopy Indications:              Dysphagia, Nausea with vomiting, Weight loss.                            Please see 10/09/17 outpatient GI consultation note                            for complete details. Medicines:                See the Anesthesia note for documentation of the                            administered medications Procedure:                Pre-Anesthesia Assessment:                           - Prior to the procedure, a History and Physical                            was performed, and patient medications and                            allergies were reviewed. The patient's tolerance of                            previous anesthesia was also reviewed. The risks                            and benefits of the procedure and the sedation                            options and risks were discussed with the patient.                            All questions were answered, and informed consent                            was obtained. Prior Anticoagulants: The patient has                            taken no previous anticoagulant or antiplatelet                            agents. ASA Grade Assessment: I - A normal, healthy                            patient. After reviewing the risks and benefits,  the patient was deemed in satisfactory condition to                            undergo the procedure.                           After obtaining informed consent, the endoscope was                            passed under direct vision. Throughout the                            procedure, the patient's blood pressure, pulse, and                            oxygen saturations were monitored continuously.  The                            Endoscope was introduced through the mouth, and                            advanced to the third part of duodenum. The upper                            GI endoscopy was accomplished without difficulty.                            The patient tolerated the procedure well. Scope In: Scope Out: Findings:                 The examined esophagus was normal. Biopsies were                            taken with a cold forceps for histology.                           Diffuse mild inflammation was found in the gastric                            body. Biopsies were taken with a cold forceps for                            histology.                           A few, non-bleeding erosions were found in the                            gastric antrum. There were no stigmata of recent                            bleeding. Biopsies were taken with a cold forceps                            for  histology.                           The examined duodenum was normal. Biopsies were                            taken with a cold forceps for histology. Complications:            No immediate complications. Estimated Blood Loss:     Estimated blood loss: none. Impression:               - Normal esophagus. Biopsied.                           - Gastritis. Biopsied.                           - Non-bleeding erosive gastropathy. Biopsied.                           - Normal examined duodenum. Biopsied.                           - No obvious source for recent symptoms identified                            visually on endoscopy. Recommendation:           - Await pathology results.                           - Avoid all NSAIDs.                           - Proceed with colonoscopy today as previously                            planned. Tressia Danas MD, MD 10/24/2017 3:24:31 PM This report has been signed electronically.

## 2017-10-24 NOTE — Progress Notes (Signed)
Called to room to assist during endoscopic procedure.  Patient ID and intended procedure confirmed with present staff. Received instructions for my participation in the procedure from the performing physician.  

## 2017-10-25 ENCOUNTER — Telehealth: Payer: Self-pay | Admitting: *Deleted

## 2017-10-25 NOTE — Telephone Encounter (Signed)
  Follow up Call-  Call back number 10/24/2017  Post procedure Call Back phone  # [phone]  Permission to leave phone message Yes  Some recent data might be hidden     Patient questions:  Do you have a fever, pain , or abdominal swelling? No. Pain Score  0 *  Have you tolerated food without any problems? Yes.    Have you been able to return to your normal activities? Yes.    Do you have any questions about your discharge instructions: Diet   No. Medications  No. Follow up visit  No.  Do you have questions or concerns about your Care? No.  Actions: * If pain score is 4 or above: No action needed, pain <4.

## 2017-10-27 ENCOUNTER — Encounter: Payer: Self-pay | Admitting: Gastroenterology

## 2017-10-27 ENCOUNTER — Telehealth: Payer: Self-pay

## 2017-10-27 NOTE — Telephone Encounter (Signed)
Spoke to patient, advised of results. She is still having same symptoms. She was offered first available appointment but needed to check her schedule first. She will call back to schedule.

## 2017-11-10 ENCOUNTER — Ambulatory Visit (INDEPENDENT_AMBULATORY_CARE_PROVIDER_SITE_OTHER): Payer: PRIVATE HEALTH INSURANCE | Admitting: Family Medicine

## 2017-11-10 ENCOUNTER — Encounter: Payer: Self-pay | Admitting: Family Medicine

## 2017-11-10 VITALS — BP 110/70 | HR 94 | Temp 98.5°F | Ht 61.0 in | Wt 145.4 lb

## 2017-11-10 DIAGNOSIS — R1011 Right upper quadrant pain: Secondary | ICD-10-CM

## 2017-11-10 DIAGNOSIS — R634 Abnormal weight loss: Secondary | ICD-10-CM

## 2017-11-10 MED ORDER — PANTOPRAZOLE SODIUM 40 MG PO TBEC: 40.0000 mg | DELAYED_RELEASE_TABLET | Freq: Every day | ORAL | 1 refills | Status: DC

## 2017-11-10 NOTE — Patient Instructions (Addendum)
The only lifestyle changes that have data behind them are weight loss for the overweight/obese (not for you) and elevating the head of the bed. Finding out which foods/positions are triggers is important. Consider keeping a food journal.  If you don't hear anything in the next few business days about your referral, let us know.  Let us know if you need anything.

## 2017-11-10 NOTE — Progress Notes (Signed)
Pre visit review using our clinic review tool, if applicable. No additional management support is needed unless otherwise documented below in the visit note. 
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
## 2017-11-14 ENCOUNTER — Ambulatory Visit (HOSPITAL_BASED_OUTPATIENT_CLINIC_OR_DEPARTMENT_OTHER)
Admission: RE | Admit: 2017-11-14 | Discharge: 2017-11-14 | Disposition: A | Discharge status: Home / Self Care | Payer: PRIVATE HEALTH INSURANCE | Source: Ambulatory Visit | Attending: Family Medicine | Admitting: Family Medicine

## 2017-11-14 DIAGNOSIS — R1011 Right upper quadrant pain: Secondary | ICD-10-CM | POA: Diagnosis not present

## 2018-01-01 ENCOUNTER — Ambulatory Visit (INDEPENDENT_AMBULATORY_CARE_PROVIDER_SITE_OTHER): Payer: PRIVATE HEALTH INSURANCE | Admitting: Family Medicine

## 2018-01-01 ENCOUNTER — Encounter: Payer: Self-pay | Admitting: Family Medicine

## 2018-01-01 VITALS — BP 110/70 | HR 73 | Temp 97.7°F | Ht 61.0 in | Wt 143.1 lb

## 2018-01-01 DIAGNOSIS — J069 Acute upper respiratory infection, unspecified: Secondary | ICD-10-CM | POA: Diagnosis not present

## 2018-01-01 DIAGNOSIS — B9789 Other viral agents as the cause of diseases classified elsewhere: Secondary | ICD-10-CM

## 2018-01-01 DIAGNOSIS — H6591 Unspecified nonsuppurative otitis media, right ear: Secondary | ICD-10-CM | POA: Diagnosis not present

## 2018-01-01 MED ORDER — PREDNISONE 20 MG PO TABS: 40.0000 mg | ORAL_TABLET | Freq: Every day | ORAL | 0 refills | Status: AC

## 2018-01-01 MED ORDER — BENZONATATE 100 MG PO CAPS: 100.0000 mg | ORAL_CAPSULE | Freq: Three times a day (TID) | ORAL | 0 refills | Status: DC | PRN

## 2018-01-01 NOTE — Patient Instructions (Signed)
Continue to push fluids, practice good hand hygiene, and cover your mouth if you cough.  If you start having fevers, shaking or shortness of breath, seek immediate care.  For symptoms, consider using Vick's VapoRub on chest or under nose, air humidifier, Benadryl at night, and elevating the head of the bed. Tylenol and ibuprofen for aches and pains you may be experiencing.   Let us know if you need anything.  

## 2018-01-01 NOTE — Progress Notes (Signed)
Pre visit review using our clinic review tool, if applicable. No additional management support is needed unless otherwise documented below in the visit note. 
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
## 2018-12-10 ENCOUNTER — Ambulatory Visit: Payer: Managed Care, Other (non HMO) | Admitting: Family Medicine

## 2018-12-10 ENCOUNTER — Other Ambulatory Visit: Payer: Self-pay

## 2018-12-10 ENCOUNTER — Encounter: Payer: Self-pay | Admitting: Family Medicine

## 2018-12-10 VITALS — BP 108/68 | HR 71 | Temp 97.6°F | Ht 61.0 in | Wt 133.1 lb

## 2018-12-10 DIAGNOSIS — R109 Unspecified abdominal pain: Secondary | ICD-10-CM | POA: Insufficient documentation

## 2018-12-10 DIAGNOSIS — Z23 Encounter for immunization: Secondary | ICD-10-CM

## 2018-12-10 DIAGNOSIS — G8929 Other chronic pain: Secondary | ICD-10-CM | POA: Insufficient documentation

## 2018-12-10 MED ORDER — AMITRIPTYLINE HCL 25 MG PO TABS: 25.0000 mg | ORAL_TABLET | Freq: Every day | ORAL | 3 refills | Status: DC
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
## 2018-12-10 NOTE — Patient Instructions (Addendum)
Let Melinda Munoz know if you need anything.  Keep the diet clean and stay active.  Keep on eye on food triggers.  Take the medicine at night and don't get pregnant on this medication.  If no improvement in the next 4 weeks, send me a message and we will get you set up with Dr. Lyndel Safe.  Let Melinda Munoz know if you need anything.

## 2019-01-02 ENCOUNTER — Other Ambulatory Visit: Payer: Self-pay | Admitting: Family Medicine

## 2019-01-02 DIAGNOSIS — R109 Unspecified abdominal pain: Secondary | ICD-10-CM

## 2019-01-02 DIAGNOSIS — G8929 Other chronic pain: Secondary | ICD-10-CM

## 2019-03-14 ENCOUNTER — Other Ambulatory Visit: Payer: Self-pay

## 2019-03-15 ENCOUNTER — Ambulatory Visit (INDEPENDENT_AMBULATORY_CARE_PROVIDER_SITE_OTHER): Payer: Managed Care, Other (non HMO) | Admitting: Medical

## 2019-03-15 VITALS — BP 119/84 | HR 95 | Temp 96.8°F | Resp 12 | Ht 61.0 in | Wt 145.6 lb

## 2019-03-15 DIAGNOSIS — S61214A Laceration without foreign body of right ring finger without damage to nail, initial encounter: Secondary | ICD-10-CM | POA: Diagnosis not present

## 2019-03-15 MED ORDER — CEPHALEXIN 500 MG PO CAPS: 500.0000 mg | ORAL_CAPSULE | Freq: Two times a day (BID) | ORAL | 0 refills | Status: DC

## 2019-03-15 NOTE — Progress Notes (Signed)
   Subjective:    Patient ID: Melinda Munoz, female    DOB: 03-17-1985, 34 y.o.   MRN: [id number]  HPI  Pt in for rt 4th digit laceration after washing wine glass on Wednesday(area healing by secondary intention). The glass broke and cut her 4t digit and lateral aspect mcp joint area. Pt is right handed. She works cutting. Pt went to [facility] almost immedi[facility]ley. Pt last tetanus about 7 years ago. Pt states uc put steristrips.  Pt described and showed me picture of flap.   Pt has been wearing a splint. Pt had no dc.  Pt states some throbbing pain.   lmp- weeks.   Review of Systems  Respiratory: Negative for cough, chest tightness, shortness of breath and wheezing.   Cardiovascular: Negative for chest pain and palpitations.  Musculoskeletal: Negative for back pain.  Neurological: Negative for dizziness, speech difficulty, weakness and headaches.  Hematological: Negative for adenopathy. Does not bruise/bleed easily.  Psychiatric/Behavioral: Negative for behavioral problems.       Objective:   Physical Exam  General- no acute distress. Pleasant and alert. Rt hand- 4th digit. Lateral aspect at pip joint. Looks like flap shaped cut. No dc. Appears to be viable tissue.      Assessment & Plan:  For 4th digit laceration it looks like healing well but you do have moderate pain. Possible early infection so will rx keflex. We put steristrips after removing old strips.   Keep brace on finger except for rare occasions as we discussed. Recommend not working until at least a week since cut on dominant hand and over joint.  If any joint swelling, increased pain, redness or dc return as needed.  Follow up in one week or as needed  Can use tylenol for pain.  30 minutes spent with pt. 50% of time spent counseling pt on plan going forward. Answering pt questions.

## 2019-03-15 NOTE — Patient Instructions (Signed)
For 4th digit laceration it looks like healing well but you do have moderate pain. Possible early infection so will rx keflex. We put steristrips after removing old strips.   Keep brace on finger except for rare occasions as we discussed. Recommend not working until at least a week since cut on dominant hand and over joint.  If any joint swelling, increased pain, redness or dc return as needed.  Follow up in one week or as needed  Can use tylenol for pain.

## 2019-03-20 ENCOUNTER — Encounter: Payer: Self-pay | Admitting: Medical

## 2019-03-22 ENCOUNTER — Ambulatory Visit: Payer: Managed Care, Other (non HMO) | Admitting: Family Medicine

## 2019-03-22 ENCOUNTER — Telehealth: Payer: Self-pay | Admitting: Medical

## 2019-03-22 ENCOUNTER — Encounter: Payer: Self-pay | Admitting: Family Medicine

## 2019-03-22 ENCOUNTER — Other Ambulatory Visit: Payer: Self-pay

## 2019-03-22 VITALS — BP 98/70 | HR 100 | Temp 97.9°F | Resp 18 | Ht 61.0 in | Wt 144.0 lb

## 2019-03-22 DIAGNOSIS — S61214D Laceration without foreign body of right ring finger without damage to nail, subsequent encounter: Secondary | ICD-10-CM | POA: Diagnosis not present

## 2019-03-22 DIAGNOSIS — S61214A Laceration without foreign body of right ring finger without damage to nail, initial encounter: Secondary | ICD-10-CM | POA: Insufficient documentation

## 2019-03-22 DIAGNOSIS — Z23 Encounter for immunization: Secondary | ICD-10-CM

## 2019-03-22 MED ORDER — CEPHALEXIN 500 MG PO CAPS: 500.0000 mg | ORAL_CAPSULE | Freq: Two times a day (BID) | ORAL | 0 refills | Status: DC

## 2019-03-22 NOTE — Telephone Encounter (Signed)
Rx keflex sent to pt pharmacy.  
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
## 2019-03-22 NOTE — Assessment & Plan Note (Signed)
Pt to go to ortho today for further eval Concerned about severe pain and weakness in finger

## 2019-03-25 ENCOUNTER — Ambulatory Visit: Payer: Managed Care, Other (non HMO) | Admitting: Medical

## 2019-03-25 NOTE — Telephone Encounter (Signed)
I'm so sorry that happed but glad they got you in with a hand specialist Your finger looked like it was healing well on the outside anyway--- so I don't think an extra 3 days would have done anything so if you did not take it that is ok

## 2019-04-08 ENCOUNTER — Ambulatory Visit
Admission: EM | Admit: 2019-04-08 | Discharge: 2019-04-08 | Disposition: A | Discharge status: Home / Self Care | Payer: Managed Care, Other (non HMO) | Attending: Emergency Medicine | Admitting: Emergency Medicine

## 2019-04-08 ENCOUNTER — Ambulatory Visit (INDEPENDENT_AMBULATORY_CARE_PROVIDER_SITE_OTHER)
Admission: RE | Admit: 2019-04-08 | Discharge: 2019-04-08 | Disposition: A | Discharge status: Home / Self Care | Payer: Managed Care, Other (non HMO) | Source: Ambulatory Visit

## 2019-04-08 DIAGNOSIS — R300 Dysuria: Secondary | ICD-10-CM | POA: Diagnosis not present

## 2019-04-08 DIAGNOSIS — N3001 Acute cystitis with hematuria: Secondary | ICD-10-CM | POA: Insufficient documentation

## 2019-04-08 LAB — POCT URINALYSIS DIP (MANUAL ENTRY)
Bilirubin, UA: NEGATIVE
Glucose, UA: NEGATIVE mg/dL
Ketones, POC UA: NEGATIVE mg/dL
Nitrite, UA: NEGATIVE
Protein Ur, POC: NEGATIVE mg/dL
Spec Grav, UA: >=1.03 — AB (ref 1.010–1.025)
Urobilinogen, UA: 0.2 E.U./dL
pH, UA: 5.5 (ref 5.0–8.0)

## 2019-04-08 MED ORDER — FLUCONAZOLE 200 MG PO TABS: 200.0000 mg | ORAL_TABLET | Freq: Once | ORAL | 0 refills | Status: AC

## 2019-04-08 MED ORDER — CEPHALEXIN 500 MG PO CAPS: 500.0000 mg | ORAL_CAPSULE | Freq: Two times a day (BID) | ORAL | 0 refills | Status: AC

## 2019-04-08 NOTE — Discharge Instructions (Addendum)
Take antibiotic as directed. May take Diflucan end of antibiotic course to prevent yeast infection. Important follow-up with PCP, possibly urology. Return for worsening urinary symptoms, blood in urine, abdominal back pain, fever.
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
## 2019-04-08 NOTE — ED Triage Notes (Signed)
Pt here for UA after virtual visit

## 2019-04-10 LAB — URINE CULTURE: Culture: 20000 — AB

## 2019-06-03 ENCOUNTER — Telehealth: Payer: Managed Care, Other (non HMO) | Admitting: Physician Assistant

## 2019-06-03 DIAGNOSIS — J069 Acute upper respiratory infection, unspecified: Secondary | ICD-10-CM | POA: Diagnosis not present

## 2019-06-03 DIAGNOSIS — R059 Cough, unspecified: Secondary | ICD-10-CM

## 2019-06-03 DIAGNOSIS — R05 Cough: Secondary | ICD-10-CM

## 2019-06-03 MED ORDER — BENZONATATE 100 MG PO CAPS: 100.0000 mg | ORAL_CAPSULE | Freq: Three times a day (TID) | ORAL | 0 refills | Status: DC | PRN

## 2019-06-03 MED ORDER — FLUTICASONE PROPIONATE 50 MCG/ACT NA SUSP: 2.0000 | Freq: Every day | NASAL | 0 refills | Status: DC

## 2019-06-03 MED ORDER — PROMETHAZINE-DM 6.25-15 MG/5ML PO SYRP: 5.0000 mL | ORAL_SOLUTION | Freq: Four times a day (QID) | ORAL | 0 refills | Status: DC | PRN

## 2019-06-03 NOTE — Progress Notes (Signed)
We are sorry you are not feeling well.  Here is how we plan to help!  Based on what you have shared with me, it looks like you may have a viral upper respiratory infection.  Upper respiratory infections are caused by a large number of viruses; however, rhinovirus is the most common cause.   Symptoms vary from person to person, with common symptoms including sore throat, cough, fatigue or lack of energy and feeling of general discomfort.  A low-grade fever of up to 100.4 may present, but is often uncommon.  Symptoms vary however, and are closely related to a person's age or underlying illnesses.  The most common symptoms associated with an upper respiratory infection are nasal discharge or congestion, cough, sneezing, headache and pressure in the ears and face.  These symptoms usually persist for about 3 to 10 days, but can last up to 2 weeks.  It is important to know that upper respiratory infections do not cause serious illness or complications in most cases.    Upper respiratory infections can be transmitted from person to person, with the most common method of transmission being a person's hands.  The virus is able to live on the skin and can infect other persons for up to 2 hours after direct contact.  Also, these can be transmitted when someone coughs or sneezes; thus, it is important to cover the mouth to reduce this risk.  To keep the spread of the illness at bay, good hand hygiene is very important.  This is an infection that is most likely caused by a virus. There are no specific treatments other than to help you with the symptoms until the infection runs its course.  We are sorry you are not feeling well.  Here is how we plan to help!   For nasal congestion, you may use an oral decongestants such as Mucinex D or if you have glaucoma or high blood pressure use plain Mucinex.  Saline nasal spray or nasal drops can help and can safely be used as often as needed for congestion.  For your congestion,  I have prescribed Fluticasone nasal spray one spray in each nostril twice a day  If you do not have a history of heart disease, hypertension, diabetes or thyroid disease, prostate/bladder issues or glaucoma, you may also use Sudafed to treat nasal congestion.  It is highly recommended that you consult with a pharmacist or your primary care physician to ensure this medication is safe for you to take.     If you have a cough, you may use cough suppressants such as Delsym and Robitussin.  If you have glaucoma or high blood pressure, you can also use Coricidin HBP.   For cough I have prescribed for you A prescription cough medication called Tessalon Perles 100 mg. You may take 1-2 capsules every 8 hours as needed for cough And Phenergan DM for cough. Use this as directed.   If you have a sore or scratchy throat, use a saltwater gargle-  to  teaspoon of salt dissolved in a 4-ounce to 8-ounce glass of warm water.  Gargle the solution for approximately 15-30 seconds and then spit.  It is important not to swallow the solution.    You can also use throat lozenges/cough drops and Chloraseptic spray to help with throat pain or discomfort.  Warm or cold liquids can also be helpful in relieving throat pain.  For headache, pain or general discomfort, you can use Ibuprofen or Tylenol as directed.  Some authorities believe that zinc sprays or the use of Echinacea may shorten the course of your symptoms.   HOME CARE . Only take medications as instructed by your medical team. . Be sure to drink plenty of fluids. Water is fine as well as fruit juices, sodas and electrolyte beverages. You may want to stay away from caffeine or alcohol. If you are nauseated, try taking small sips of liquids. How do you know if you are getting enough fluid? Your urine should be a pale yellow or almost colorless. . Get rest. . Taking a steamy shower or using a humidifier may help nasal congestion and ease sore throat pain. You can  place a towel over your head and breathe in the steam from hot water coming from a faucet. . Using a saline nasal spray works much the same way. . Cough drops, hard candies and sore throat lozenges may ease your cough. . Avoid close contacts especially the very young and the elderly . Cover your mouth if you cough or sneeze . Always remember to wash your hands.   GET HELP RIGHT AWAY IF: . You develop worsening fever. . If your symptoms do not improve within 10 days . You develop yellow or green discharge from your nose over 3 days. . You have coughing fits . You develop a severe head ache or visual changes. . You develop shortness of breath, difficulty breathing or start having chest pain . Your symptoms persist after you have completed your treatment plan  MAKE SURE YOU   Understand these instructions.  Will watch your condition.  Will get help right away if you are not doing well or get worse.  Your e-visit answers were reviewed by a board certified advanced clinical practitioner to complete your personal care plan. Depending upon the condition, your plan could have included both over the counter or prescription medications. Please review your pharmacy choice. If there is a problem, you may call our nursing hot line at and have the prescription routed to another pharmacy. Your safety is important to [place]. If you have drug allergies check your prescription carefully.   You can use MyChart to ask questions about today's visit, request a non-urgent call back, or ask for a work or school excuse for 24 hours related to this e-Visit. If it has been greater than 24 hours you will need to follow up with your provider, or enter a new e-Visit to address those concerns. You will get an e-mail in the next two days asking about your experience.  I hope that your e-visit has been valuable and will speed your recovery. Thank you for using e-visits.    Greater than 5 minutes, yet less than 10  minutes of time have been spent researching, coordinating and implementing care for this patient today.

## 2019-08-02 ENCOUNTER — Other Ambulatory Visit: Payer: Self-pay | Admitting: Family Medicine

## 2019-08-19 LAB — NOVEL CORONAVIRUS, NAA: SARS-CoV-2, NAA: DETECTED

## 2019-08-26 ENCOUNTER — Telehealth: Payer: Managed Care, Other (non HMO) | Admitting: Family

## 2019-08-26 ENCOUNTER — Other Ambulatory Visit: Payer: Self-pay

## 2019-08-26 ENCOUNTER — Ambulatory Visit (INDEPENDENT_AMBULATORY_CARE_PROVIDER_SITE_OTHER): Payer: Managed Care, Other (non HMO)

## 2019-08-26 ENCOUNTER — Encounter: Payer: Self-pay | Admitting: Emergency Medicine

## 2019-08-26 ENCOUNTER — Ambulatory Visit
Admission: EM | Admit: 2019-08-26 | Discharge: 2019-08-26 | Disposition: A | Discharge status: Home / Self Care | Payer: Managed Care, Other (non HMO) | Attending: Family Medicine | Admitting: Family Medicine

## 2019-08-26 ENCOUNTER — Encounter: Payer: Self-pay | Admitting: Family Medicine

## 2019-08-26 DIAGNOSIS — R1012 Left upper quadrant pain: Secondary | ICD-10-CM

## 2019-08-26 DIAGNOSIS — R109 Unspecified abdominal pain: Secondary | ICD-10-CM | POA: Diagnosis not present

## 2019-08-26 DIAGNOSIS — R1031 Right lower quadrant pain: Secondary | ICD-10-CM

## 2019-08-26 DIAGNOSIS — R509 Fever, unspecified: Secondary | ICD-10-CM

## 2019-08-26 LAB — POCT URINALYSIS DIP (MANUAL ENTRY)
Bilirubin, UA: NEGATIVE
Glucose, UA: NEGATIVE mg/dL
Leukocytes, UA: NEGATIVE
Nitrite, UA: NEGATIVE
Protein Ur, POC: NEGATIVE mg/dL
Spec Grav, UA: 1.01 (ref 1.010–1.025)
Urobilinogen, UA: 0.2 E.U./dL
pH, UA: 6.5 (ref 5.0–8.0)

## 2019-08-26 LAB — POCT URINE PREGNANCY: Preg Test, Ur: NEGATIVE

## 2019-08-26 MED ORDER — POLYETHYLENE GLYCOL 3350 17 G PO PACK: 17.0000 g | PACK | Freq: Every day | ORAL | 0 refills | Status: DC

## 2019-08-26 MED ORDER — ONDANSETRON 4 MG PO TBDP
4.0000 mg | ORAL_TABLET | Freq: Three times a day (TID) | ORAL | 0 refills | Status: DC | PRN
Start: 2019-08-26 — End: 2020-03-30

## 2019-08-26 NOTE — Discharge Instructions (Addendum)
Your x ray did not show anything concerning.  You do have some stool on the left side.  Some of your symptoms may be related to constipation.  Zofran for nausea and vomiting as needed.  Could also be gastritis related.  You can try some pepcid for this OTC Otherwise you can follow-up with your primary care doctor for further management as needed.  If symptoms become severe please go to the ER.

## 2019-08-26 NOTE — Progress Notes (Signed)
Based on what you shared with me, I feel your condition warrants further evaluation and I recommend that you be seen for a face to face office visit.  Given your symptoms of fever and abdominal pain, you need to be seen face to face today! You can either go to an Urgent Care or ED, but need to be seen urgently.    NOTE: If you entered your credit card information for this eVisit, you will not be charged. You may see a "hold" on your card for the $35 but that hold will drop off and you will not have a charge processed.   If you are having a true medical emergency please call 911.      For an urgent face to face visit, [place] has five urgent care centers for your convenience:      NEW:  [facility] at [facility] Directions [phone] [address] [place], [place] [address] . 10 am - 6pm [place] - Friday    [facility]) Get Driving Directions [phone] [address] [place], [place] [address] . 10 am to 8 pm [place]-Friday . 12 pm to 8 pm [facility] at [facility] Get Driving Directions [phone] [address] [place], [place] [address] . 8 am to 8 pm [place]-Friday . 9 am to 6 pm Saturday . 11 am to 6 pm Sunday     [facility] at [facility] Get Driving Directions  [phone] [address] [place], [place] [address] . 8 am to 8 pm [place]-Friday . 8 am to 4 pm [facility] at [facility] Directions [phone] [address] [place], [place] [address] . 12 pm to 6 pm [place]-Friday      Your e-visit answers were reviewed by a board certified advanced clinical practitioner to complete your personal care plan.  Thank you for using e-Visits.

## 2019-08-26 NOTE — Telephone Encounter (Signed)
Could you advise in absence of PCP as DOD.

## 2019-08-26 NOTE — ED Triage Notes (Addendum)
Pt presents to [facility] for assessment of left sided abdominal pain, rib pain, mucousy stools and nausea with emesis in the last 24 hours.  Patient states the coughing causes her emesis.  Patient states she ate some jello and theraflu this morning, and vomited it back up.  Diagnosed with COVID on 08/14/19.  Patient states three days ago she began to have a fever.  Patient states temp of 99 this morning.  Pt stopped taking amitryptaline 3 weeks ago.  States she was having toe numbness and severe anxiety.

## 2019-08-27 ENCOUNTER — Telehealth: Payer: Self-pay | Admitting: Family Medicine

## 2019-08-27 ENCOUNTER — Telehealth (HOSPITAL_COMMUNITY): Payer: Self-pay | Admitting: Emergency Medicine

## 2019-08-27 ENCOUNTER — Telehealth: Payer: Managed Care, Other (non HMO) | Admitting: Family

## 2019-08-27 LAB — COMPREHENSIVE METABOLIC PANEL
ALT: 30 IU/L (ref 0–32)
AST: 37 IU/L (ref 0–40)
Albumin/Globulin Ratio: 1.8 (ref 1.2–2.2)
Albumin: 4.6 g/dL (ref 3.8–4.8)
Alkaline Phosphatase: 71 IU/L (ref 48–121)
BUN/Creatinine Ratio: 10 (ref 9–23)
BUN: 7 mg/dL (ref 6–20)
Bilirubin Total: 0.3 mg/dL (ref 0.0–1.2)
CO2: 24 mmol/L (ref 20–29)
Calcium: 8.7 mg/dL (ref 8.7–10.2)
Chloride: 95 mmol/L — ABNORMAL LOW (ref 96–106)
Creatinine, Ser: 0.72 mg/dL (ref 0.57–1.00)
GFR calc Af Amer: 128 mL/min/{1.73_m2} (ref >59)
GFR calc non Af Amer: 111 mL/min/{1.73_m2} (ref >59)
Globulin, Total: 2.6 g/dL (ref 1.5–4.5)
Glucose: 105 mg/dL — ABNORMAL HIGH (ref 65–99)
Potassium: 3.5 mmol/L (ref 3.5–5.2)
Sodium: 139 mmol/L (ref 134–144)
Total Protein: 7.2 g/dL (ref 6.0–8.5)

## 2019-08-27 LAB — CBC WITH DIFFERENTIAL/PLATELET
Basophils Absolute: 0 10*3/uL (ref 0.0–0.2)
Basos: 1 %
EOS (ABSOLUTE): 0 10*3/uL (ref 0.0–0.4)
Eos: 0 %
Hematocrit: 45.7 % (ref 34.0–46.6)
Hemoglobin: 15.7 g/dL (ref 11.1–15.9)
Immature Grans (Abs): 0 10*3/uL (ref 0.0–0.1)
Immature Granulocytes: 1 %
Lymphocytes Absolute: 0.8 10*3/uL (ref 0.7–3.1)
Lymphs: 28 %
MCH: 30.7 pg (ref 26.6–33.0)
MCHC: 34.4 g/dL (ref 31.5–35.7)
MCV: 89 fL (ref 79–97)
Monocytes Absolute: 0.3 10*3/uL (ref 0.1–0.9)
Monocytes: 10 %
Neutrophils Absolute: 1.8 10*3/uL (ref 1.4–7.0)
Neutrophils: 60 %
Platelets: 113 10*3/uL — ABNORMAL LOW (ref 150–450)
RBC: 5.11 x10E6/uL (ref 3.77–5.28)
RDW: 13 % (ref 11.7–15.4)
WBC: 2.9 10*3/uL — ABNORMAL LOW (ref 3.4–10.8)

## 2019-08-27 LAB — LIPASE: Lipase: 56 U/L (ref 14–72)

## 2019-08-27 LAB — AMYLASE: Amylase: 81 U/L (ref 31–110)

## 2019-08-27 NOTE — Telephone Encounter (Signed)
Patient called stating dx with Covid 19 on July 19,2021. Patient went to urgent care yesterday not felling well. Urgent care asked patient to follow up with PCP due to her abnormal labs. Pt's white blood cell count is low and blood count is low.  Please advise

## 2019-08-27 NOTE — Telephone Encounter (Signed)
Reviewed labs with Traci, APP who asked me to call patient and encourage her to follow-up with PCP since unsure of the significance of low WBC and low platelet.  Patient states she is feeling much better today, and not concerned enough to go to ER at this time, but ER precautions reviewed.  Patient contacted, verified identity using two identifiers, and discussed plan of care.  Patient verbalized understanding, and states she will message her PCP and see if she needs to make an appointment.
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
## 2019-08-28 NOTE — Telephone Encounter (Signed)
Called the patient informed of PCP response/instructions. She verbalized understanding////agreed to do so.

## 2019-08-28 NOTE — Telephone Encounter (Signed)
Sched f/u appt in around 10 days in person if she is feeling better, otherwise wil have to be virtual and we can discuss following up on lab abnormalities. This will likely take some time for her to feel better and also for the blood counts to normalize. Ty.

## 2019-09-04 ENCOUNTER — Other Ambulatory Visit: Payer: Self-pay

## 2019-09-04 ENCOUNTER — Encounter: Payer: Self-pay | Admitting: Family Medicine

## 2019-09-04 ENCOUNTER — Telehealth (INDEPENDENT_AMBULATORY_CARE_PROVIDER_SITE_OTHER): Payer: Managed Care, Other (non HMO) | Admitting: Family Medicine

## 2019-09-04 DIAGNOSIS — F430 Acute stress reaction: Secondary | ICD-10-CM

## 2019-09-04 MED ORDER — SERTRALINE HCL 50 MG PO TABS: 50.0000 mg | ORAL_TABLET | Freq: Every day | ORAL | 3 refills | Status: DC

## 2019-09-04 MED ORDER — ALPRAZOLAM 0.5 MG PO TABS: 0.2500 mg | ORAL_TABLET | Freq: Two times a day (BID) | ORAL | 0 refills | Status: DC | PRN
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
## 2019-09-26 ENCOUNTER — Other Ambulatory Visit: Payer: Self-pay | Admitting: Family Medicine

## 2019-09-26 DIAGNOSIS — F430 Acute stress reaction: Secondary | ICD-10-CM

## 2019-11-01 ENCOUNTER — Other Ambulatory Visit: Payer: Self-pay

## 2019-11-01 ENCOUNTER — Encounter: Payer: Self-pay | Admitting: Family Medicine

## 2019-11-01 ENCOUNTER — Ambulatory Visit (INDEPENDENT_AMBULATORY_CARE_PROVIDER_SITE_OTHER): Payer: Managed Care, Other (non HMO) | Admitting: Family Medicine

## 2019-11-01 VITALS — BP 118/70 | HR 81 | Temp 98.1°F | Ht 61.0 in | Wt 144.4 lb

## 2019-11-01 DIAGNOSIS — N939 Abnormal uterine and vaginal bleeding, unspecified: Secondary | ICD-10-CM

## 2019-11-01 DIAGNOSIS — F430 Acute stress reaction: Secondary | ICD-10-CM | POA: Diagnosis not present

## 2019-11-01 LAB — POCT URINE PREGNANCY: Preg Test, Ur: NEGATIVE

## 2019-11-01 MED ORDER — NAPROXEN 500 MG PO TABS
500.0000 mg | ORAL_TABLET | Freq: Two times a day (BID) | ORAL | 0 refills | Status: DC
Start: 2019-11-01 — End: 2020-03-30

## 2019-11-01 MED ORDER — SERTRALINE HCL 50 MG PO TABS: 50.0000 mg | ORAL_TABLET | Freq: Every day | ORAL | 2 refills | Status: DC
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
## 2019-11-01 NOTE — Addendum Note (Signed)
Addended by: Thelma Barge D on: 11/01/2019 01:18 PM   Modules accepted: Orders

## 2019-11-01 NOTE — Patient Instructions (Addendum)
Send me a message Mon-Tuesday if we aren't turning the corner.   Call Center for [facility] at [facility] at [phone] for an appointment.  They are located at [address], [address], [place], [place], [address] (right across the hall from our office).  Let us know if you need anything.

## 2020-03-30 ENCOUNTER — Encounter: Payer: Self-pay | Admitting: Family Medicine

## 2020-03-30 ENCOUNTER — Ambulatory Visit (HOSPITAL_BASED_OUTPATIENT_CLINIC_OR_DEPARTMENT_OTHER)
Admission: RE | Admit: 2020-03-30 | Discharge: 2020-03-30 | Disposition: A | Discharge status: Home / Self Care | Payer: BC Managed Care – PPO | Source: Ambulatory Visit | Attending: Family Medicine | Admitting: Family Medicine

## 2020-03-30 ENCOUNTER — Ambulatory Visit: Payer: BC Managed Care – PPO | Admitting: Family Medicine

## 2020-03-30 ENCOUNTER — Other Ambulatory Visit: Payer: Self-pay

## 2020-03-30 VITALS — BP 108/64 | HR 98 | Temp 98.2°F | Ht 61.0 in | Wt 145.1 lb

## 2020-03-30 DIAGNOSIS — M79644 Pain in right finger(s): Secondary | ICD-10-CM | POA: Diagnosis not present

## 2020-03-30 DIAGNOSIS — M79672 Pain in left foot: Secondary | ICD-10-CM | POA: Diagnosis not present

## 2020-03-30 DIAGNOSIS — M7732 Calcaneal spur, left foot: Secondary | ICD-10-CM | POA: Diagnosis not present

## 2020-03-30 MED ORDER — MELOXICAM 15 MG PO TABS: 15.0000 mg | ORAL_TABLET | Freq: Every day | ORAL | 0 refills | Status: DC
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
## 2020-03-30 NOTE — Patient Instructions (Signed)
Wear the shoe when you are walking or bearing weight.  Ice/cold pack over area for 10-15 min twice daily.  Heat (pad or rice pillow in microwave) over affected area, 10-15 minutes twice daily.   OK to take Tylenol 1000 mg (2 extra strength tabs) or 975 mg (3 regular strength tabs) every 6 hours as needed.  We will be in touch regarding your X-ray results.   Let us know if you need anything.  Hand Exercises Hand exercises can be helpful for almost anyone. These exercises can strengthen the hands, improve flexibility and movement, and increase blood flow to the hands. These results can make work and daily tasks easier. Hand exercises can be especially helpful for people who have joint pain from arthritis or have nerve damage from overuse (carpal tunnel syndrome). These exercises can also help people who have injured a hand. Exercises Most of these hand exercises are gentle stretching and motion exercises. It is usually safe to do them often throughout the day. Warming up your hands before exercise may help to reduce stiffness. You can do this with gentle massage or by placing your hands in warm water for 10-15 minutes. It is normal to feel some stretching, pulling, tightness, or mild discomfort as you begin new exercises. This will gradually improve. Stop an exercise right away if you feel sudden, severe pain or your pain gets worse. Ask your health care provider which exercises are best for you. Knuckle bend or "claw" fist 1. Stand or sit with your arm, hand, and all five fingers pointed straight up. Make sure to keep your wrist straight during the exercise. 2. Gently bend your fingers down toward your palm until the tips of your fingers are touching the top of your palm. Keep your big knuckle straight and just bend the small knuckles in your fingers. 3. Hold this position for 3 seconds. 4. Straighten (extend) your fingers back to the starting position. Repeat this exercise 5-10 times with each  hand. Full finger fist 1. Stand or sit with your arm, hand, and all five fingers pointed straight up. Make sure to keep your wrist straight during the exercise. 2. Gently bend your fingers into your palm until the tips of your fingers are touching the middle of your palm. 3. Hold this position for 3 seconds. 4. Extend your fingers back to the starting position, stretching every joint fully. Repeat this exercise 5-10 times with each hand. Straight fist 1. Stand or sit with your arm, hand, and all five fingers pointed straight up. Make sure to keep your wrist straight during the exercise. 2. Gently bend your fingers at the big knuckle, where your fingers meet your hand, and the middle knuckle. Keep the knuckle at the tips of your fingers straight and try to touch the bottom of your palm. 3. Hold this position for 3 seconds. 4. Extend your fingers back to the starting position, stretching every joint fully. Repeat this exercise 5-10 times with each hand. Tabletop 1. Stand or sit with your arm, hand, and all five fingers pointed straight up. Make sure to keep your wrist straight during the exercise. 2. Gently bend your fingers at the big knuckle, where your fingers meet your hand, as far down as you can while keeping the small knuckles in your fingers straight. Think of forming a tabletop with your fingers. 3. Hold this position for 3 seconds. 4. Extend your fingers back to the starting position, stretching every joint fully. Repeat this exercise 5-10 times  with each hand. Finger spread 1. Place your hand flat on a table with your palm facing down. Make sure your wrist stays straight as you do this exercise. 2. Spread your fingers and thumb apart from each other as far as you can until you feel a gentle stretch. Hold this position for 3 seconds. 3. Bring your fingers and thumb tight together again. Hold this position for 3 seconds. Repeat this exercise 5-10 times with each hand. Making  circles 1. Stand or sit with your arm, hand, and all five fingers pointed straight up. Make sure to keep your wrist straight during the exercise. 2. Make a circle by touching the tip of your thumb to the tip of your index finger. 3. Hold for 3 seconds. Then open your hand wide. 4. Repeat this motion with your thumb and each finger on your hand. Repeat this exercise 5-10 times with each hand. Thumb motion 1. Sit with your forearm resting on a table and your wrist straight. Your thumb should be facing up toward the ceiling. Keep your fingers relaxed as you move your thumb. 2. Lift your thumb up as high as you can toward the ceiling. Hold for 3 seconds. 3. Bend your thumb across your palm as far as you can, reaching the tip of your thumb for the small finger (pinkie) side of your palm. Hold for 3 seconds. Repeat this exercise 5-10 times with each hand. Grip strengthening  1. Hold a stress ball or other soft ball in the middle of your hand. 2. Slowly increase the pressure, squeezing the ball as much as you can without causing pain. Think of bringing the tips of your fingers into the middle of your palm. All of your finger joints should bend when doing this exercise. 3. Hold your squeeze for 3 seconds, then relax. Repeat this exercise 5-10 times with each hand. Contact a health care provider if:  Your hand pain or discomfort gets much worse when you do an exercise.  Your hand pain or discomfort does not improve within 2 hours after you exercise. If you have any of these problems, stop doing these exercises right away. Do not do them again unless your health care provider says that you can. Get help right away if:  You develop sudden, severe hand pain or swelling. If this happens, stop doing these exercises right away. Do not do them again unless your health care provider says that you can. Make sure you discuss any questions you have with your health care provider. Document Revised: 05/10/2018  Document Reviewed: 01/18/2018 Elsevier Patient Education  2020 [employer].

## 2020-07-27 ENCOUNTER — Other Ambulatory Visit: Payer: Self-pay

## 2020-07-27 ENCOUNTER — Ambulatory Visit: Payer: BC Managed Care – PPO | Admitting: Family Medicine

## 2020-08-04 ENCOUNTER — Ambulatory Visit: Payer: BC Managed Care – PPO | Admitting: Family Medicine

## 2021-02-10 ENCOUNTER — Telehealth: Payer: Self-pay

## 2021-02-10 ENCOUNTER — Ambulatory Visit: Payer: BC Managed Care – PPO | Admitting: Family Medicine

## 2021-02-10 ENCOUNTER — Encounter: Payer: Self-pay | Admitting: Family Medicine

## 2021-02-10 VITALS — BP 106/60 | HR 90 | Temp 98.6°F | Ht 61.0 in | Wt 146.1 lb

## 2021-02-10 DIAGNOSIS — F411 Generalized anxiety disorder: Secondary | ICD-10-CM | POA: Diagnosis not present

## 2021-02-10 DIAGNOSIS — M79644 Pain in right finger(s): Secondary | ICD-10-CM

## 2021-02-10 MED ORDER — ESCITALOPRAM OXALATE 10 MG PO TABS: 10.0000 mg | ORAL_TABLET | Freq: Every day | ORAL | 2 refills | Status: DC

## 2021-02-10 MED ORDER — MELOXICAM 15 MG PO TABS: 15.0000 mg | ORAL_TABLET | Freq: Every day | ORAL | 0 refills | Status: DC
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
## 2021-02-10 NOTE — Telephone Encounter (Signed)
Nurse Assessment Nurse: Elesa Hacker, RN, Nash Dimmer Date/Time Lamount Cohen Time): 02/10/2021 8:11:01 AM Confirm and document reason for call. If symptomatic, describe symptoms. ---Caller states she is having swelling and pain in thumb. Caller advised that she has been having pain since last week and she has been popping her thumb and states that it is swollen. No temp. Does the patient have any new or worsening symptoms? ---Yes Will a triage be completed? ---Yes Related visit to physician within the last 2 weeks? ---No Does the PT have any chronic conditions? (i.e. diabetes, asthma, this includes High risk factors for pregnancy, etc.) ---Yes List chronic conditions. ---IBS Is the patient pregnant or possibly pregnant? (Ask all females between the ages of 62-55) ---No Is this a behavioral health or substance abuse call? ---No Guidelines Guideline Title Affirmed Question Affirmed Notes Nurse Date/Time (Eastern Time) Finger Pain [1] Numbness (i.e., loss of sensation) in hand or fingers AND [2] new-onset Deaton, RN, Nash Dimmer 02/10/2021 8:12:13 AM Disp. Time Lamount Cohen Time) Disposition Final User PLEASE NOTE: All timestamps contained within this report are represented as [place] Standard Time. CONFIDENTIALTY NOTICE: This fax transmission is intended only for the addressee. It contains information that is legally privileged, confidential or otherwise protected from use or disclosure. If you are not the intended recipient, you are strictly prohibited from reviewing, disclosing, copying using or disseminating any of this information or taking any action in reliance on or regarding this information. If you have received this fax in error, please notify us immediately by telephone so that we can arrange for its return to [place]. Phone: [phone], Toll-Free: [phone], Fax: [phone] Page: 2 of 2 Call Id: [id number] 02/10/2021 8:15:19 AM See PCP within 24 Hours Yes Deaton, RN, Cory Roughen Disagree/Comply  Comply Caller Understands Yes PreDisposition Did not know what to do Care Advice Given Per Guideline SEE PCP WITHIN 24 HOURS: * IF OFFICE WILL BE OPEN: You need to be examined within the next 24 hours. Call your doctor (or NP/PA) when the office opens and make an appointment. * IBUPROFEN (E.G., MOTRIN, ADVIL): Take 400 mg (two 200 mg pills) by mouth every 6 hours. The most you should take is 6 pills a day (1,200 mg total). * ACETAMINOPHEN - EXTRA STRENGTH TYLENOL: Take 1,000 mg (two 500 mg pills) every 6 to 8 hours as needed. Each Extra Strength Tylenol pill has 500 mg of acetaminophen. The most you should take is 6 pills a day (3,000 mg total). Note: In [place], the maximum is 8 pills a day (4,000 mg total). CALL BACK IF: * Fever occurs * You become worse CARE ADVICE given per Finger Pain (Adult) guideline. Comments User: Wandra Scot, RN Date/Time Lamount Cohen Time): 02/10/2021 8:16:05 AM Caller transferred to the office for an appt. Referrals REFERRED TO PCP OFFICE  Pt scheduled w/ Dr. Carmelia Roller today.

## 2021-02-10 NOTE — Patient Instructions (Signed)
Ice/cold pack over area for 10-15 min twice daily.  Heat (pad or rice pillow in microwave) over affected area, 10-15 minutes twice daily.   Wear the splint at night and as much as you can during the day.  OK to take Tylenol 1000 mg (2 extra strength tabs) or 975 mg (3 regular strength tabs) every 6 hours as needed.  Let us know if you need anything.  Please consider counseling. Contact [phone] to schedule an appointment or inquire about cost/insurance coverage.  [facility] located at [address], [place], [place].  Phone number = [phone].  Dr. Regan Lemming - Adult Psychiatry.    [facility] located at [address] [place], [place], [place]. Phone number = [phone].   [facility] located at [address], [place], [place].  Phone number = [phone].   [facility] located at [address] [place], [place], [place].  Phone number = [phone].  Coping skills Choose 5 that work for you: Take a deep breath Count to 20 Read a book Do a puzzle Meditate Bake Sing Knit Garden Pray Go outside Call a friend Listen to music Take a walk Color Send a note Take a bath Watch a movie Be alone in a quiet place Pet an animal Visit a friend Journal Exercise Stretch   Hand Exercises Hand exercises can be helpful for almost anyone. These exercises can strengthen the hands, improve flexibility and movement, and increase blood flow to the hands. These results can make work and daily tasks easier. Hand exercises can be especially helpful for people who have joint pain from arthritis or have nerve damage from overuse (carpal tunnel syndrome). These exercises can also help people who have injured a hand. Exercises Most of these hand exercises are gentle stretching and motion exercises. It is usually safe to do them often throughout the day. Warming up your hands before exercise may help to reduce  stiffness. You can do this with gentle massage or by placing your hands in warm water for 10-15 minutes. It is normal to feel some stretching, pulling, tightness, or mild discomfort as you begin new exercises. This will gradually improve. Stop an exercise right away if you feel sudden, severe pain or your pain gets worse. Ask your health care provider which exercises are best for you. Knuckle bend or "claw" fist Stand or sit with your arm, hand, and all five fingers pointed straight up. Make sure to keep your wrist straight during the exercise. Gently bend your fingers down toward your palm until the tips of your fingers are touching the top of your palm. Keep your big knuckle straight and just bend the small knuckles in your fingers. Hold this position for 3 seconds. Straighten (extend) your fingers back to the starting position. Repeat this exercise 5-10 times with each hand. Full finger fist Stand or sit with your arm, hand, and all five fingers pointed straight up. Make sure to keep your wrist straight during the exercise. Gently bend your fingers into your palm until the tips of your fingers are touching the middle of your palm. Hold this position for 3 seconds. Extend your fingers back to the starting position, stretching every joint fully. Repeat this exercise 5-10 times with each hand. Straight fist Stand or sit with your arm, hand, and all five fingers pointed straight up. Make sure to keep your wrist straight during the exercise. Gently bend your fingers at the big knuckle, where your fingers meet  your hand, and the middle knuckle. Keep the knuckle at the tips of your fingers straight and try to touch the bottom of your palm. Hold this position for 3 seconds. Extend your fingers back to the starting position, stretching every joint fully. Repeat this exercise 5-10 times with each hand. Tabletop Stand or sit with your arm, hand, and all five fingers pointed straight up. Make sure to  keep your wrist straight during the exercise. Gently bend your fingers at the big knuckle, where your fingers meet your hand, as far down as you can while keeping the small knuckles in your fingers straight. Think of forming a tabletop with your fingers. Hold this position for 3 seconds. Extend your fingers back to the starting position, stretching every joint fully. Repeat this exercise 5-10 times with each hand. Finger spread Place your hand flat on a table with your palm facing down. Make sure your wrist stays straight as you do this exercise. Spread your fingers and thumb apart from each other as far as you can until you feel a gentle stretch. Hold this position for 3 seconds. Bring your fingers and thumb tight together again. Hold this position for 3 seconds. Repeat this exercise 5-10 times with each hand. Making circles Stand or sit with your arm, hand, and all five fingers pointed straight up. Make sure to keep your wrist straight during the exercise. Make a circle by touching the tip of your thumb to the tip of your index finger. Hold for 3 seconds. Then open your hand wide. Repeat this motion with your thumb and each finger on your hand. Repeat this exercise 5-10 times with each hand. Thumb motion Sit with your forearm resting on a table and your wrist straight. Your thumb should be facing up toward the ceiling. Keep your fingers relaxed as you move your thumb. Lift your thumb up as high as you can toward the ceiling. Hold for 3 seconds. Bend your thumb across your palm as far as you can, reaching the tip of your thumb for the small finger (pinkie) side of your palm. Hold for 3 seconds. Repeat this exercise 5-10 times with each hand. Grip strengthening  Hold a stress ball or other soft ball in the middle of your hand. Slowly increase the pressure, squeezing the ball as much as you can without causing pain. Think of bringing the tips of your fingers into the middle of your palm. All  of your finger joints should bend when doing this exercise. Hold your squeeze for 3 seconds, then relax. Repeat this exercise 5-10 times with each hand. Contact a health care provider if: Your hand pain or discomfort gets much worse when you do an exercise. Your hand pain or discomfort does not improve within 2 hours after you exercise. If you have any of these problems, stop doing these exercises right away. Do not do them again unless your health care provider says that you can. Get help right away if: You develop sudden, severe hand pain or swelling. If this happens, stop doing these exercises right away. Do not do them again unless your health care provider says that you can. Make sure you discuss any questions you have with your health care provider. Document Revised: 05/10/2018 Document Reviewed: 01/18/2018 Elsevier Patient Education  2020 [employer].

## 2021-02-25 DIAGNOSIS — Z23 Encounter for immunization: Secondary | ICD-10-CM | POA: Diagnosis not present

## 2021-02-25 DIAGNOSIS — S01151A Open bite of right eyelid and periocular area, initial encounter: Secondary | ICD-10-CM | POA: Diagnosis not present

## 2021-02-25 DIAGNOSIS — S00211A Abrasion of right eyelid and periocular area, initial encounter: Secondary | ICD-10-CM | POA: Diagnosis not present

## 2021-02-25 DIAGNOSIS — W540XXA Bitten by dog, initial encounter: Secondary | ICD-10-CM | POA: Diagnosis not present

## 2021-03-04 ENCOUNTER — Other Ambulatory Visit: Payer: Self-pay | Admitting: Family Medicine

## 2021-03-04 DIAGNOSIS — F411 Generalized anxiety disorder: Secondary | ICD-10-CM

## 2021-04-14 ENCOUNTER — Encounter: Payer: Self-pay | Admitting: Family Medicine

## 2021-04-14 ENCOUNTER — Other Ambulatory Visit: Payer: Self-pay | Admitting: Family Medicine

## 2021-04-14 DIAGNOSIS — M79644 Pain in right finger(s): Secondary | ICD-10-CM

## 2021-04-16 NOTE — Progress Notes (Signed)
? ? Melinda Munoz D.Judd Gaudier ?[facility] ?[address] [place] [address] ?Phone: [phone] ?  ?Assessment and Plan:   ?  ?1. Chronic pain of right thumb ?2. 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  Recommend continuing bracing for activities that cause sharp thumb pain, but otherwise recommend coming out of brace to promote range of motion ?- Start HEP for thumb strengthening.  Offered PT, but declined at this time.  Could consider at follow-up visit if no improvement or worsening of symptoms ?- Start meloxicam 7.5 mg daily x2 weeks.  If still having pain after 2 weeks, complete 3rd-week of meloxicam. May use remaining meloxicam as needed once daily for pain control.  Do not to use additional NSAIDs while taking meloxicam.  May use Tylenol [phone] mg 2 to 3 times a day for breakthrough pain. ?- Of note, patient had history of stomach ulcer on EGD in 2019.  Because of this, we will not use NSAIDs for prolonged periods of time.  Plan to only use them for the next 2 to 3 weeks to decrease inflammation and then discontinue ?- X-ray obtained in clinic.  My interpretation: No acute fracture or dislocation.  Unremarkable imaging ?- DG Hand Complete Right; Future  ?  ?Pertinent previous records reviewed include PCP note 02/10/2021 ?  ?Follow Up: 3 weeks for reevaluation.  Could consider formal PT versus CSI versus ultrasound at that time based on symptoms ?  ?Subjective:   ?I, Melinda Munoz, am serving as a Neurosurgeon for Doctor [employer] ? ?Chief Complaint: right thumb pain ? ?HPI:  ?04/19/2021 ?Patient is a 36 year old female complaining to right thumb pain. Patient states that its been hurting for 3 months, hurts all the time has been taking tylnenol, no  numbness or tingling just an achy pain, when she works the pain radiates but all the pain is the proximal area. 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 ? ?Relevant Historical Information: History of stomach ulcer on EGD in 2019 ? ?Additional pertinent review of systems negative. ? ? ?Current Outpatient Medications:  ?  meloxicam (MOBIC) 15 MG tablet, Take 1 tablet (15 mg total) by mouth daily., Disp: 30 tablet, Rfl: 0 ?  meloxicam (MOBIC) 15 MG tablet, Take 0.5 tablets (7.5 mg total) by mouth daily., Disp: 21 tablet, Rfl: 0 ?  escitalopram (LEXAPRO) 10 MG tablet, TAKE 1 TABLET BY MOUTH EVERY DAY, Disp: 90 tablet, Rfl: 1  ? ?Objective:   ?  ?Vitals:  ? 04/19/21 1358  ?BP: 100/72  ?Pulse: 70  ?SpO2: 99%  ?Weight: 146 lb (66.2 kg)  ?Height: 5\' 1"  (1.549 m)  ?  ?  ?Body mass index is 27.59 kg/m?.  ?  ?Physical Exam:   ? ?General: Appears well, nad, nontoxic and pleasant ?Neuro:sensation intact, strength is 5/5 with df/pf/inv/ev, muscle tone wnl ?Skin:no susupicious lesions or rashes ? ?Right hand/wrist:  No deformity or swelling appreciated. ?Right wrist ROM  Ext 90, flexion70, radial/ulnar deviation 30 ?nttp over the snauff box, dorsal carpals, volar carpals, radial styloid, ulnar styloid, 1st mcp, tfcc ?Negative Tinel's ?Positive finklestein ?Neg tfcc bounce  test ?No pain with resisted first digit deviation  ?Pain of first digit with resisted flexion and extension ?Weakness of first digit flexion, extension, ulnar deviation ? ?Electronically signed by:  ?Melinda Munoz D.Judd Gaudier ?[facility] ?2:46 PM 04/19/21 ?

## 2021-04-19 ENCOUNTER — Ambulatory Visit: Payer: BC Managed Care – PPO | Admitting: Sports Medicine

## 2021-04-19 ENCOUNTER — Ambulatory Visit (INDEPENDENT_AMBULATORY_CARE_PROVIDER_SITE_OTHER): Payer: BC Managed Care – PPO

## 2021-04-19 ENCOUNTER — Other Ambulatory Visit: Payer: Self-pay

## 2021-04-19 VITALS — BP 100/72 | HR 70 | Ht 61.0 in | Wt 146.0 lb

## 2021-04-19 DIAGNOSIS — M19041 Primary osteoarthritis, right hand: Secondary | ICD-10-CM | POA: Diagnosis not present

## 2021-04-19 DIAGNOSIS — M79644 Pain in right finger(s): Secondary | ICD-10-CM

## 2021-04-19 DIAGNOSIS — G8929 Other chronic pain: Secondary | ICD-10-CM

## 2021-04-19 MED ORDER — MELOXICAM 15 MG PO TABS: 7.5000 mg | ORAL_TABLET | Freq: Every day | ORAL | 0 refills | Status: DC

## 2021-04-19 NOTE — Patient Instructions (Addendum)
Good to see you  ? Start meloxicam  7.5 mg daily x2 weeks.  If still having pain after 2 weeks, complete 3rd-week of meloxicam. May use remaining meloxicam as needed once daily for pain control.  Do not to use additional NSAIDs while taking meloxicam.  May use Tylenol [phone] mg 2 to 3 times a day for breakthrough pain. ?Use thumb brace as needed for activities that cause sharp pain  ?Thumb HEP ?3 week follow up  ? ?

## 2021-05-06 NOTE — Progress Notes (Deleted)
? ?   Benito Mccreedy D.Merril Abbe ?[facility] ?[place] ?Phone: [phone] ?  ?Assessment and Plan:   ?  ?There are no diagnoses linked to this encounter.  ?*** ?  ?Pertinent previous records reviewed include *** ?  ?Follow Up: ***  ? ?  ?Subjective:   ?I, Pincus Badder, am serving as a Education administrator for Doctor [employer] ? ?Chief Complaint: right thumb pain  ? ?HPI:  ?04/19/2021 ?Patient is a 36 year old female complaining to right thumb pain. Patient states that its been hurting for 3 months, hurts all the time has been taking tylnenol, no numbness or tingling just an achy pain, when she works the pain radiates but all the pain is the proximal area. 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  ?05/10/2021 ?Patient states ?  ?Relevant Historical Information: History of stomach ulcer on EGD in 2019 ? ?Additional pertinent review of systems negative. ? ? ?Current Outpatient Medications:  ?  escitalopram (LEXAPRO) 10 MG tablet, TAKE 1 TABLET BY MOUTH EVERY DAY, Disp: 90 tablet, Rfl: 1 ?  meloxicam (MOBIC) 15 MG tablet, Take 1 tablet (15 mg total) by mouth daily., Disp: 30 tablet, Rfl: 0 ?  meloxicam (MOBIC) 15 MG tablet, Take 0.5 tablets (7.5 mg total) by mouth daily., Disp: 21 tablet, Rfl: 0  ? ?Objective:   ?  ?There were no vitals filed for this visit.  ?  ?There is no height or weight on file to calculate BMI.  ?  ?Physical Exam:   ? ?*** ? ? ?Electronically signed by:  ?Benito Mccreedy D.Merril Abbe ?[facility] ?12:55 PM 05/06/21 ?

## 2021-05-10 ENCOUNTER — Ambulatory Visit: Payer: BC Managed Care – PPO | Admitting: Sports Medicine

## 2021-06-24 DIAGNOSIS — D2330 Other benign neoplasm of skin of unspecified part of face: Secondary | ICD-10-CM | POA: Diagnosis not present

## 2021-06-24 DIAGNOSIS — L821 Other seborrheic keratosis: Secondary | ICD-10-CM | POA: Diagnosis not present

## 2021-06-24 DIAGNOSIS — M7981 Nontraumatic hematoma of soft tissue: Secondary | ICD-10-CM | POA: Diagnosis not present

## 2021-09-01 IMAGING — DX DG FOOT COMPLETE 3+V*L*
3 series · 3 of 3 positions shown · non-contrast
Comparison: None.

CLINICAL DATA: Left foot pain after dropping lid on it.

EXAM:
LEFT FOOT - COMPLETE 3+ VIEW

[foot ap]
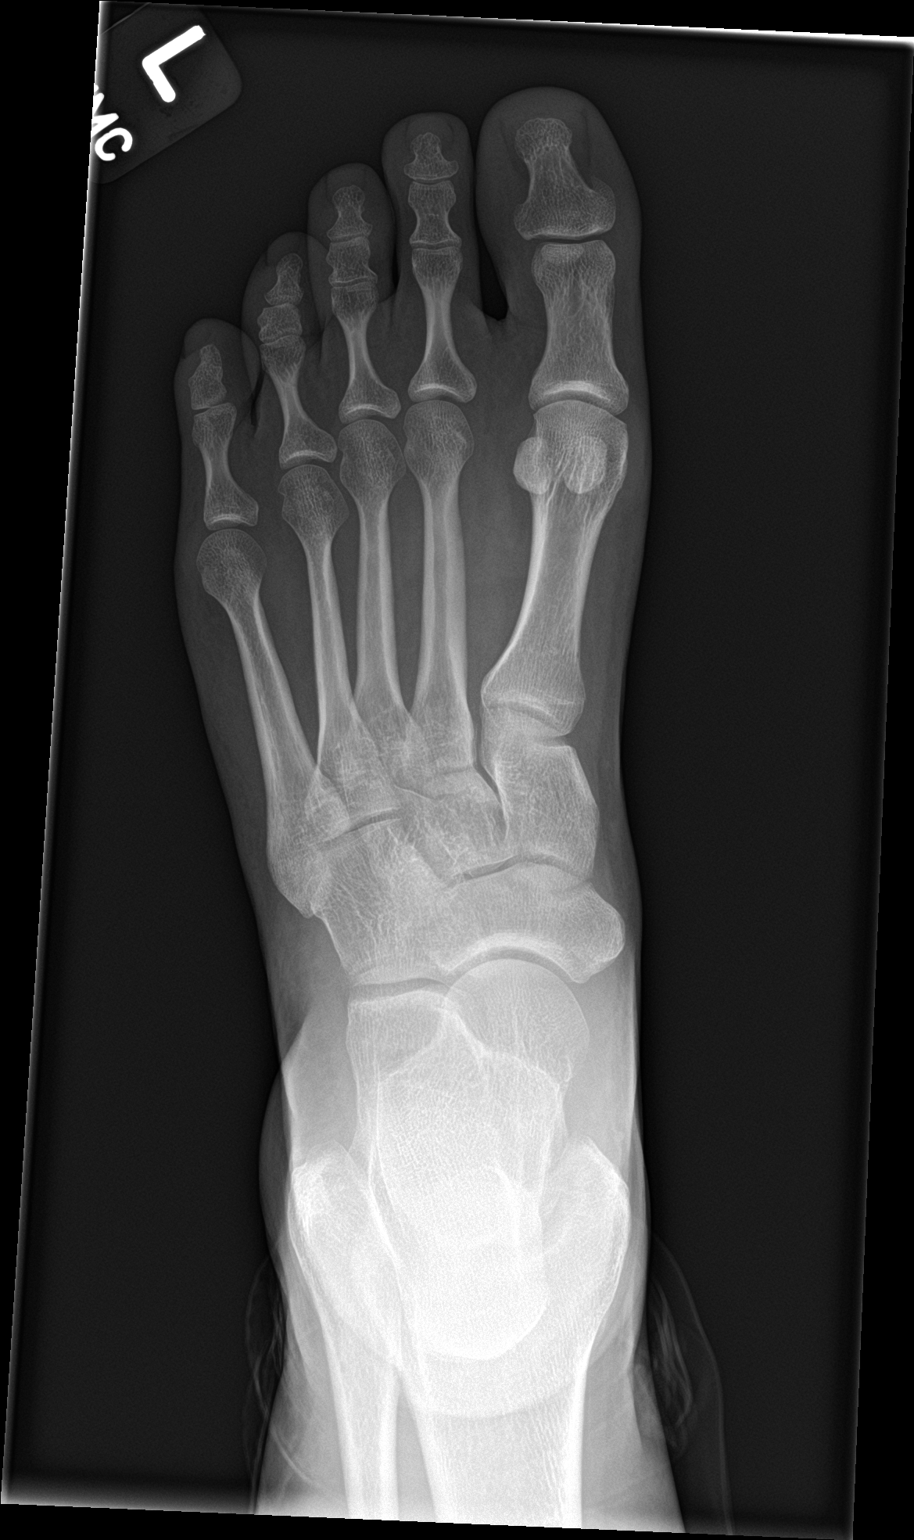

[foot obl]
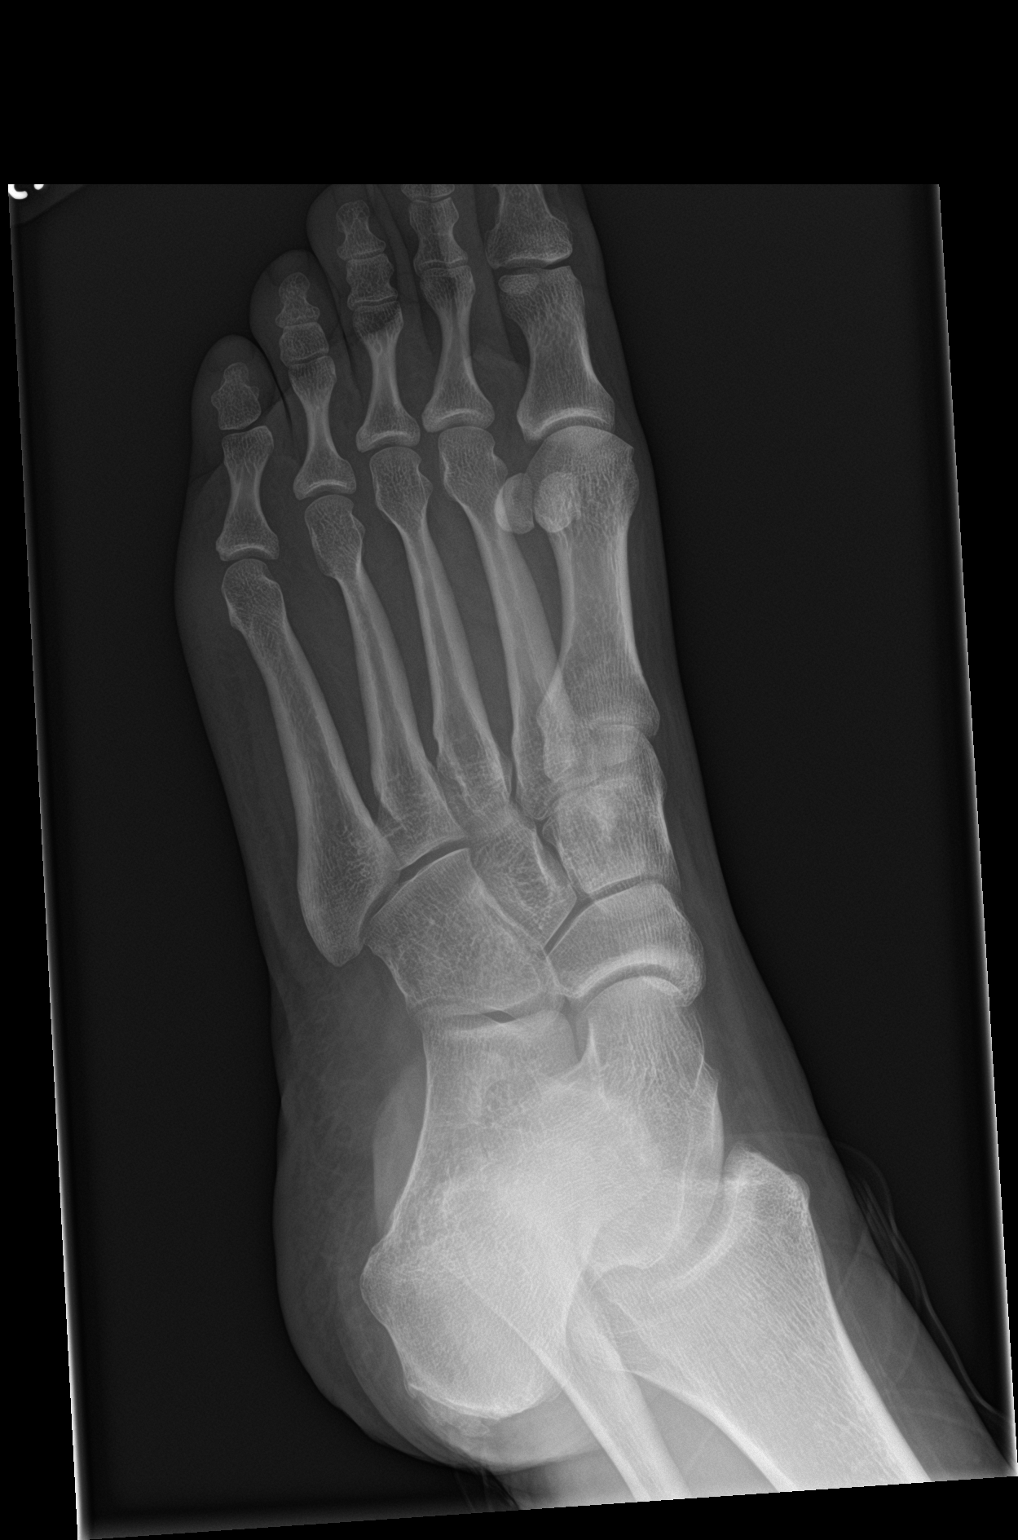

[foot lat]
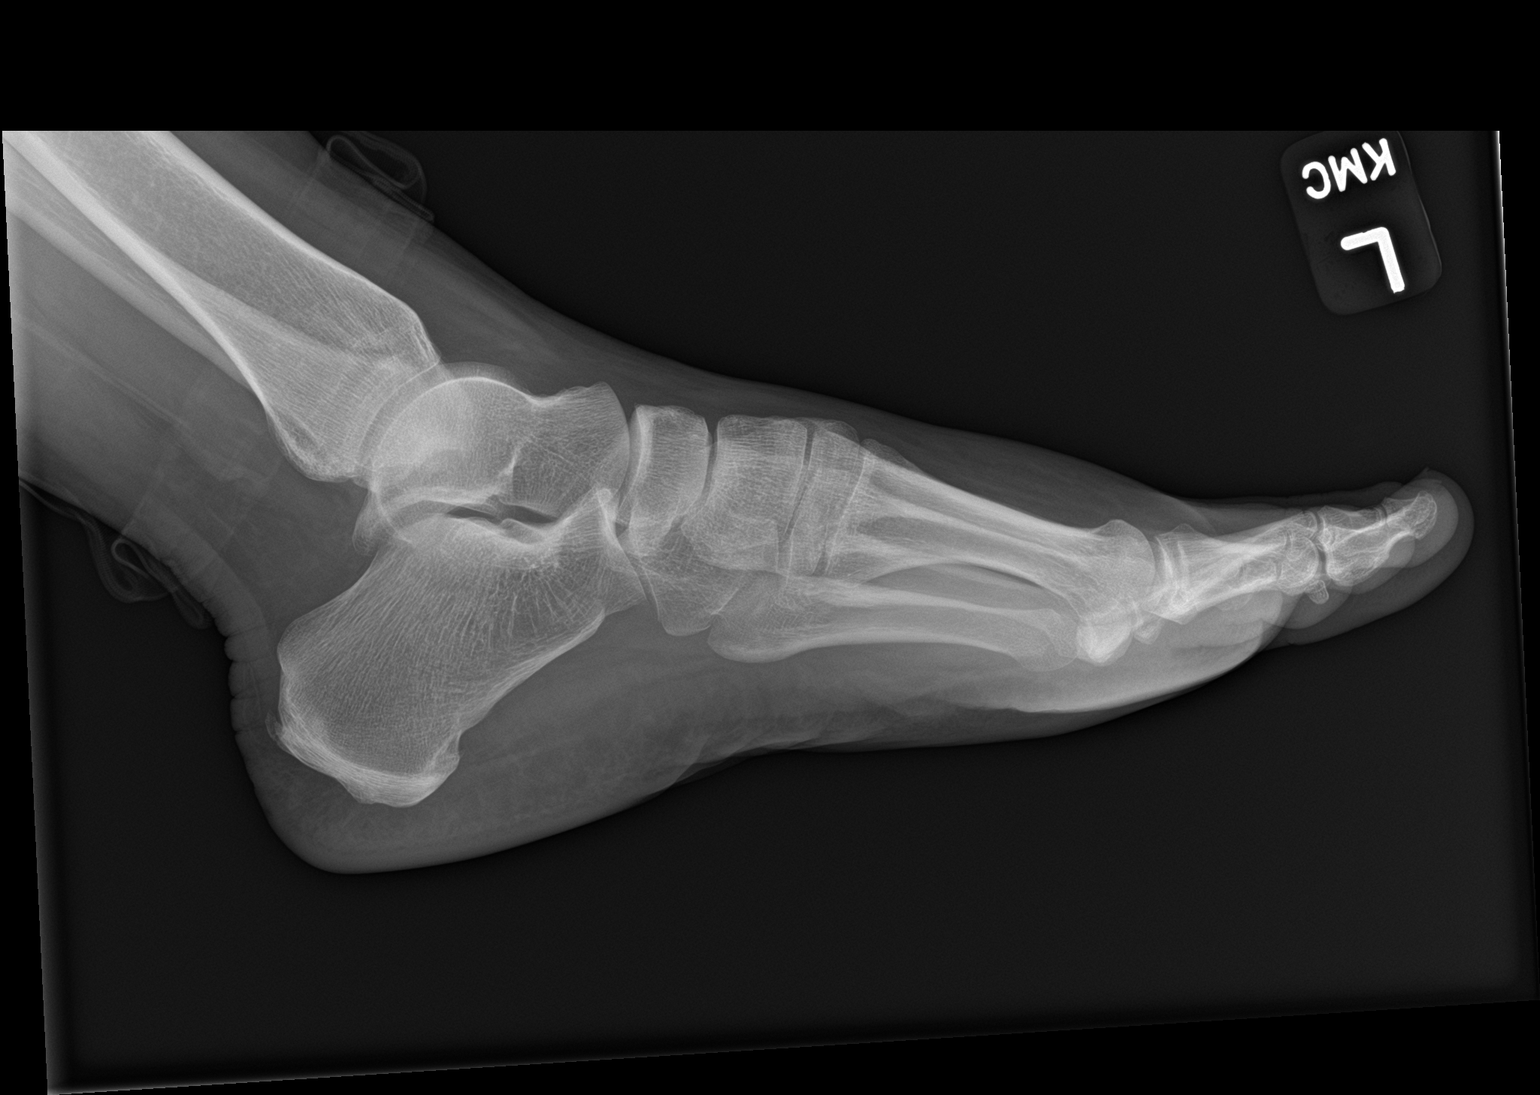

[3 of 3 positions shown; findings below may reference images not displayed]

FINDINGS: Negative for a fracture or dislocation. No significant arthropathy.
Mild spurring in the calcaneus along the insertion of the Achilles
tendon. There may be mild soft tissue swelling along the dorsum.
IMPRESSION: No acute bone abnormality to left foot.

## 2022-04-05 ENCOUNTER — Encounter: Payer: Self-pay | Admitting: Family Medicine

## 2022-04-05 ENCOUNTER — Ambulatory Visit (INDEPENDENT_AMBULATORY_CARE_PROVIDER_SITE_OTHER): Payer: Managed Care, Other (non HMO) | Admitting: Family Medicine

## 2022-04-05 VITALS — BP 120/80 | HR 70 | Temp 98.6°F | Ht 62.0 in | Wt 145.1 lb

## 2022-04-05 DIAGNOSIS — M25511 Pain in right shoulder: Secondary | ICD-10-CM

## 2022-04-05 DIAGNOSIS — G8929 Other chronic pain: Secondary | ICD-10-CM | POA: Diagnosis not present

## 2022-04-05 DIAGNOSIS — M25552 Pain in left hip: Secondary | ICD-10-CM

## 2022-04-05 NOTE — Patient Instructions (Signed)
Heat (pad or rice pillow in microwave) over affected area, 10-15 minutes twice daily.   Ice/cold pack over area for 10-15 min twice daily.  OK to take Tylenol 1000 mg (2 extra strength tabs) or 975 mg (3 regular strength tabs) every 6 hours as needed.  Ibuprofen 400-600 mg (2-3 over the counter strength tabs) every 6 hours as needed for pain.  Let us know if you need anything.  Biceps Tendon Disruption (Proximal) Rehab Do exercises exactly as told by your health care provider and adjust them as directed. It is normal to feel mild stretching, pulling, tightness, or discomfort as you do these exercises, but you should stop right away if you feel sudden pain or your pain gets worse.  Stretching and range of motion exercises These exercises warm up your muscles and joints and improve the movement and flexibility of your arm and shoulder. These exercises also help to relieve pain and stiffness. Exercise A: Shoulder flexion, standing   Stand facing a wall. Put your left / right hand on the wall. Slide your left / right hand up the wall. Stop when you feel a stretch in your shoulder, or when you reach the angle recommended by your health care provider. Use your other hand to help raise your arm, if needed. As your hand gets higher, you may need to step closer to the wall. Avoid shrugging your shoulder while you raise your arm. To do this, keep your shoulder blade tucked down toward your spine. Hold for 30 seconds. Slowly return to the starting position. Use your other arm to help, if needed. Repeat 2 times. Complete this exercise 3 times per week. Exercise B: Pendulum   Stand near a wall or a surface that you can hold onto for balance. Bend at the waist and let your left / right arm hang straight down. Use your other arm to support you. Relax your arm and shoulder muscles, and move your hips and your trunk so your left / right arm swings freely. Your arm should swing because of the motion of  your body, not because you are using your arm or shoulder muscles. Keep moving so your arm swings in the following directions, as told by your health care provider: Side to side. Forward and backward. In clockwise and counterclockwise circles. Slowly return to the starting position. Repeat 2 times. Complete this exercise 3 times per week.  Strengthening exercises These exercises build strength and endurance in your arm and shoulder. Endurance is the ability to use your muscles for a long time, even after your muscles get tired. Exercise C: Elbow flexion, neutral  Sit on a stable chair without armrests, or stand. Hold a 3-5 lb weight in your left / right hand, or hold an exercise band with both hands. Your palms should face each other at the starting position. Bend your left / right elbow and move your hand up toward your shoulder. Lead with your thumb, and keep your palm facing the same direction. Keep your other arm straight down, in the starting position. Slowly return to the starting position. Repeat 2-3 times. Complete this exercise 3 times per week. Exercise D: Forearm supination   Sit with your left / right forearm on a table. Your elbow should be below shoulder height. Rest your hand over the edge of the table so your palm faces down. If directed, hold a hammer with your left / right hand. Without moving your elbow, slowly rotate your hand so your palm faces  up toward the ceiling. If you are holding a hammer, begin by holding the hammer near the head. When this exercise gets easier for you, hold the hammer farther down the handle. Hold for 3 seconds. Slowly return to the starting position. Repeat 2 times. Complete this exercise 3 times per week. Exercise E: Scapular retraction   Sit in a stable chair without armrests, or stand. Secure an exercise band to a stable object in front of you so the band is at shoulder height. Hold one end of the exercise band in each hand. Squeeze  your shoulder blades together and move your elbows slightly behind you. Do not shrug your shoulders. Hold for 3 seconds. Slowly return to the starting position. Repeat 2 times. Complete this exercise 3 times per week. Exercise F: Scapular protraction, supine   Lie on your back on a firm surface. Hold a 3-5 lb weight in your left / right hand. Raise your left / right arm straight into the air so your hand is directly above your shoulder joint. Push the weight into the air so your shoulder lifts off of the surface that you are lying on. Do not move your head, neck, or back. Hold for 3 seconds. Slowly return to the starting position. Let your muscles relax completely before you repeat this exercise. Repeat 2 times. Complete this exercise 3 times per week. This information is not intended to replace advice given to you by your health care provider. Make sure you discuss any questions you have with your health care provider. Document Released: 01/17/2005 Document Revised: 09/24/2015 Document Reviewed: 12/26/2014 Elsevier Interactive Patient Education  2017 [place] [facility] It is normal to feel mild stretching, pulling, tightness, or discomfort as you do these exercises, but you should stop right away if you feel sudden pain or your pain gets worse.   Stretching and range of motion exercise This exercise warms up your muscles and joints and improves the movement and flexibility of your hip and pelvis. This exercise also helps to relieve pain and stiffness. Exercise A: Lunge (hip flexor stretch)      Kneel on the floor on your left / right knee. Bend your other knee so it is directly over your ankle. Keep good posture with your head over your shoulders. Tuck your tailbone underneath you. This will prevent your back from arching too much. You should feel a gentle stretch in the front of your thigh or hip. If you do not feel a stretch, slowly lunge forward with your  chest up. Hold this position for 30 seconds. Slowly return to the starting position. Repeat 2 times. Complete this exercise 3 times per week. Strengthening exercises These exercises build strength and endurance in your hip and pelvis. Endurance is the ability to use your muscles for a long time, even after they get tired. Exercise B: Bridge (hip extensors)     Lie on your back on a firm surface with your knees bent and your feet flat on the floor. Tighten your buttocks muscles and lift your bottom off the floor until the trunk of your body is level with your thighs. You should feel the muscles working in your buttocks and the back of your thighs. If this exercise is too easy, cross your arms over your chest or lift one leg while your bottom is up off the floor. Do not arch your back. Hold this position for 3 seconds. Slowly lower your hips to the starting position. Let  your muscles relax completely between repetitions. Repeat 2 times. Complete this exercise 3 times per week. Exercise C: Straight leg raises (hip abductors)     Lie on your side with your left / right leg in the top position. Lie so your head, shoulder, knee, and hip line up. Bend your bottom knee to help you balance. Lift your top leg up 4-6 inches (10-15 cm), keeping your toes pointed straight ahead. Hold this position for 2 seconds. Slowly lower your leg to the starting position and let your muscles relax completely. Repeat for a total of 10 repetitions. Repeat 2 times. Complete this exercise 3 times per week. Exercise D: Hip abductors and external rotators, quadruped Get on your hands and knees on a firm, lightly padded surface. Your hands should be directly below your shoulders, and your knees should be directly below your hips. Lift your left / right knee out to the side. Keep your knee bent. Do not twist your body. Hold this position for 3 seconds. Slowly lower your leg. Repeat for a total of 10 repetitions.  Repeat  2 times. Complete this exercise 3 times per week. Exercise E: Single leg stand Stand near a counter or door frame to hold onto as needed. It is helpful to look in a mirror for this exercise so you can watch your hip. Squeeze your left / right buttock muscles then lift up your other foot. Do not let your left / right hip push out to the side. Hold this position for 3 seconds. Repeat for a total of 10 repetitions. Repeat 2 times. Complete this exercise 3 times per week. Make sure you discuss any questions you have with your health care provider. Document Released: 01/17/2005 Document Revised: 09/24/2015 Document Reviewed: 12/30/2014 Elsevier Interactive Patient Education  [employer].
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
## 2022-09-21 IMAGING — DX DG HAND COMPLETE 3+V*R*
3 series · 3 of 3 positions shown · non-contrast
Comparison: Right wrist radiographs 11/11/2015

CLINICAL DATA: Right thumb pain for 3 months.

EXAM:
RIGHT HAND - COMPLETE 3+ VIEW

[hand ap]
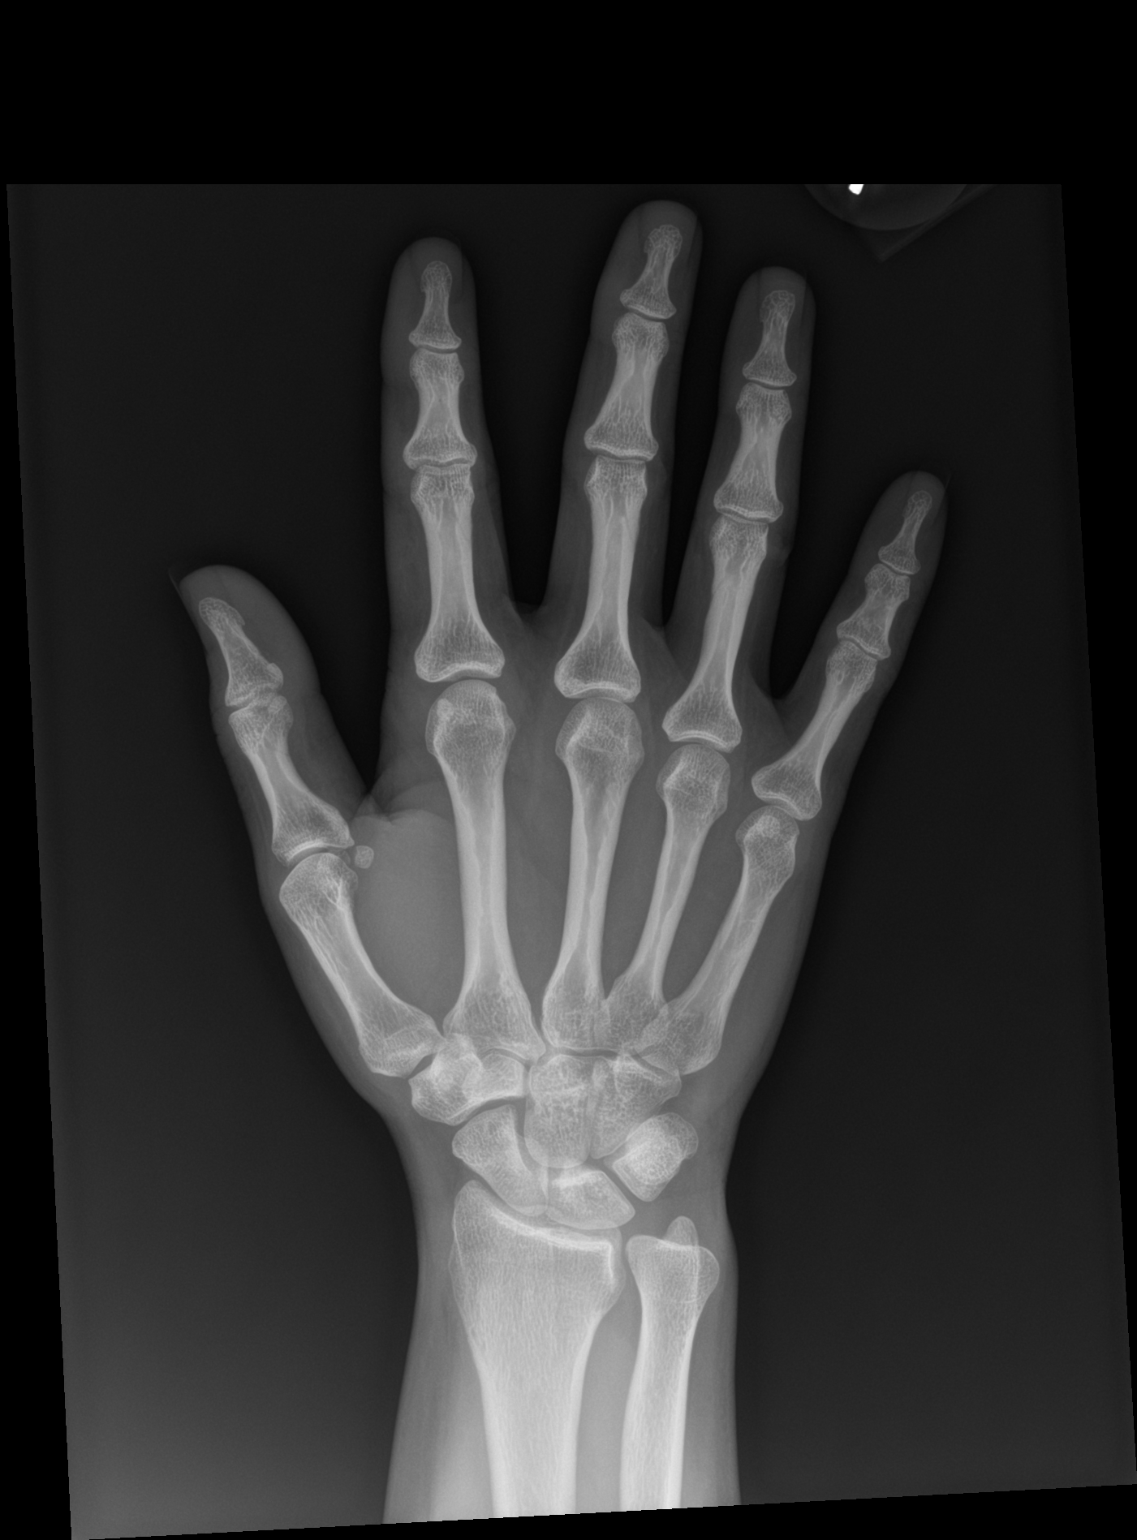

[hand obl]
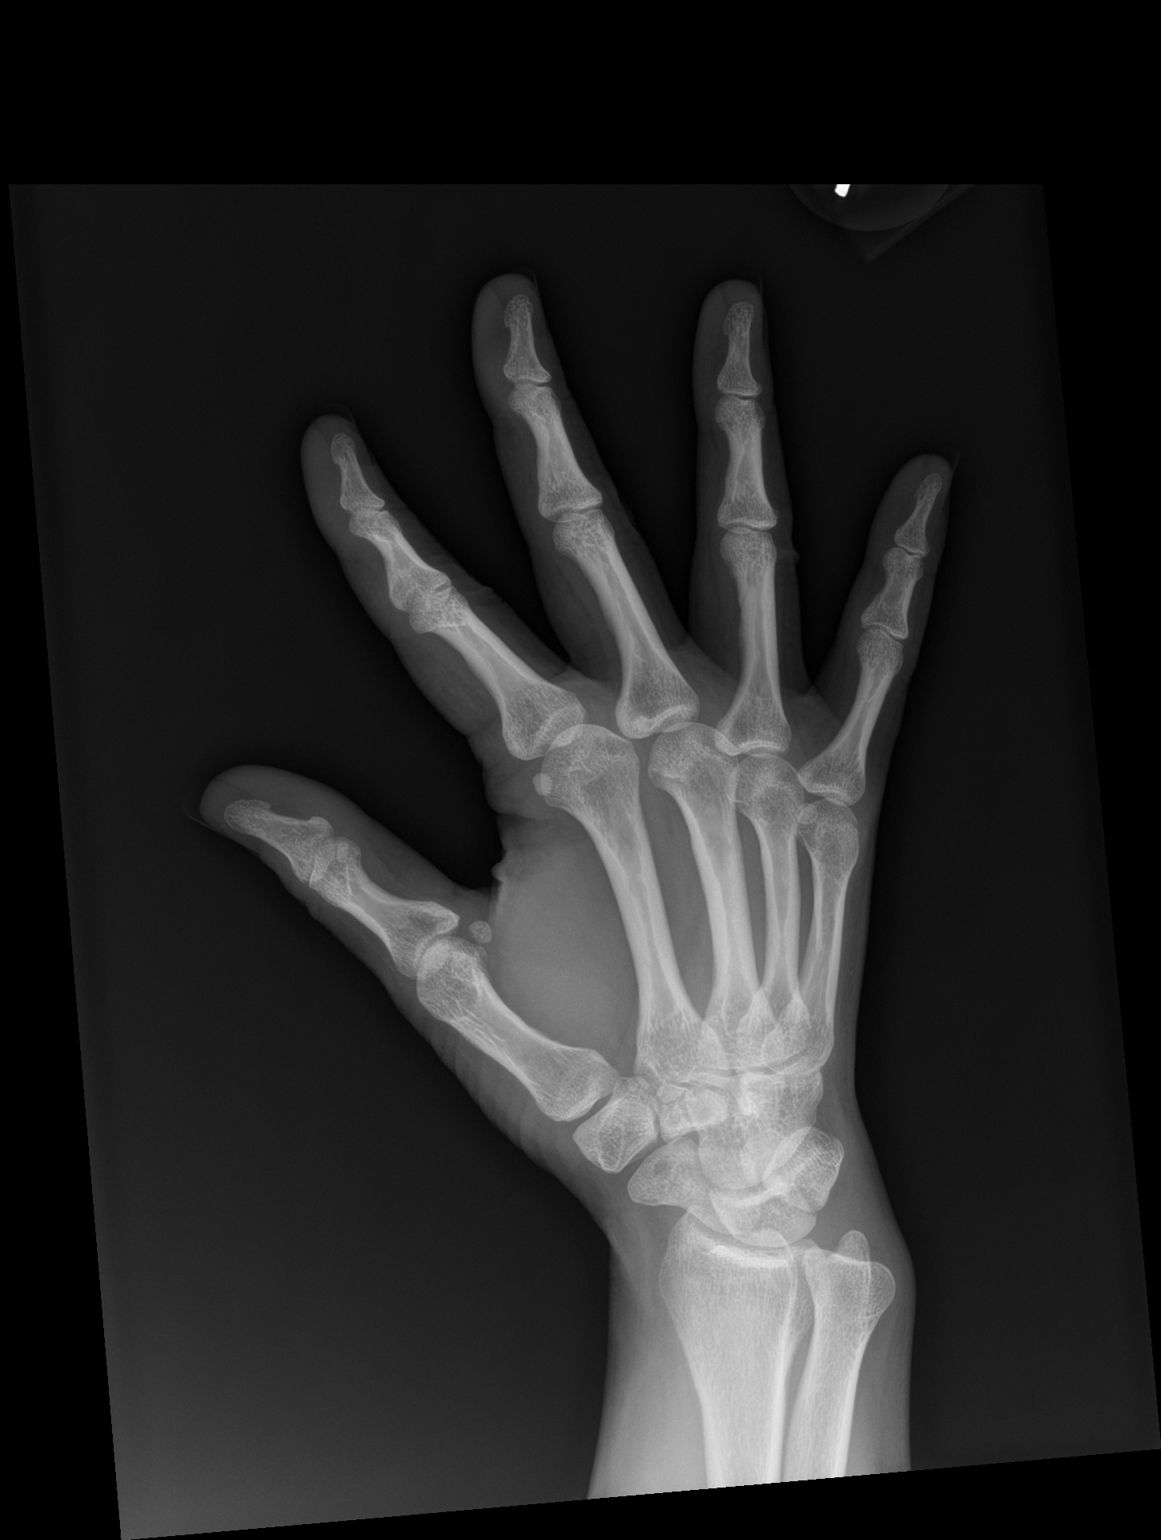

[hand lat]
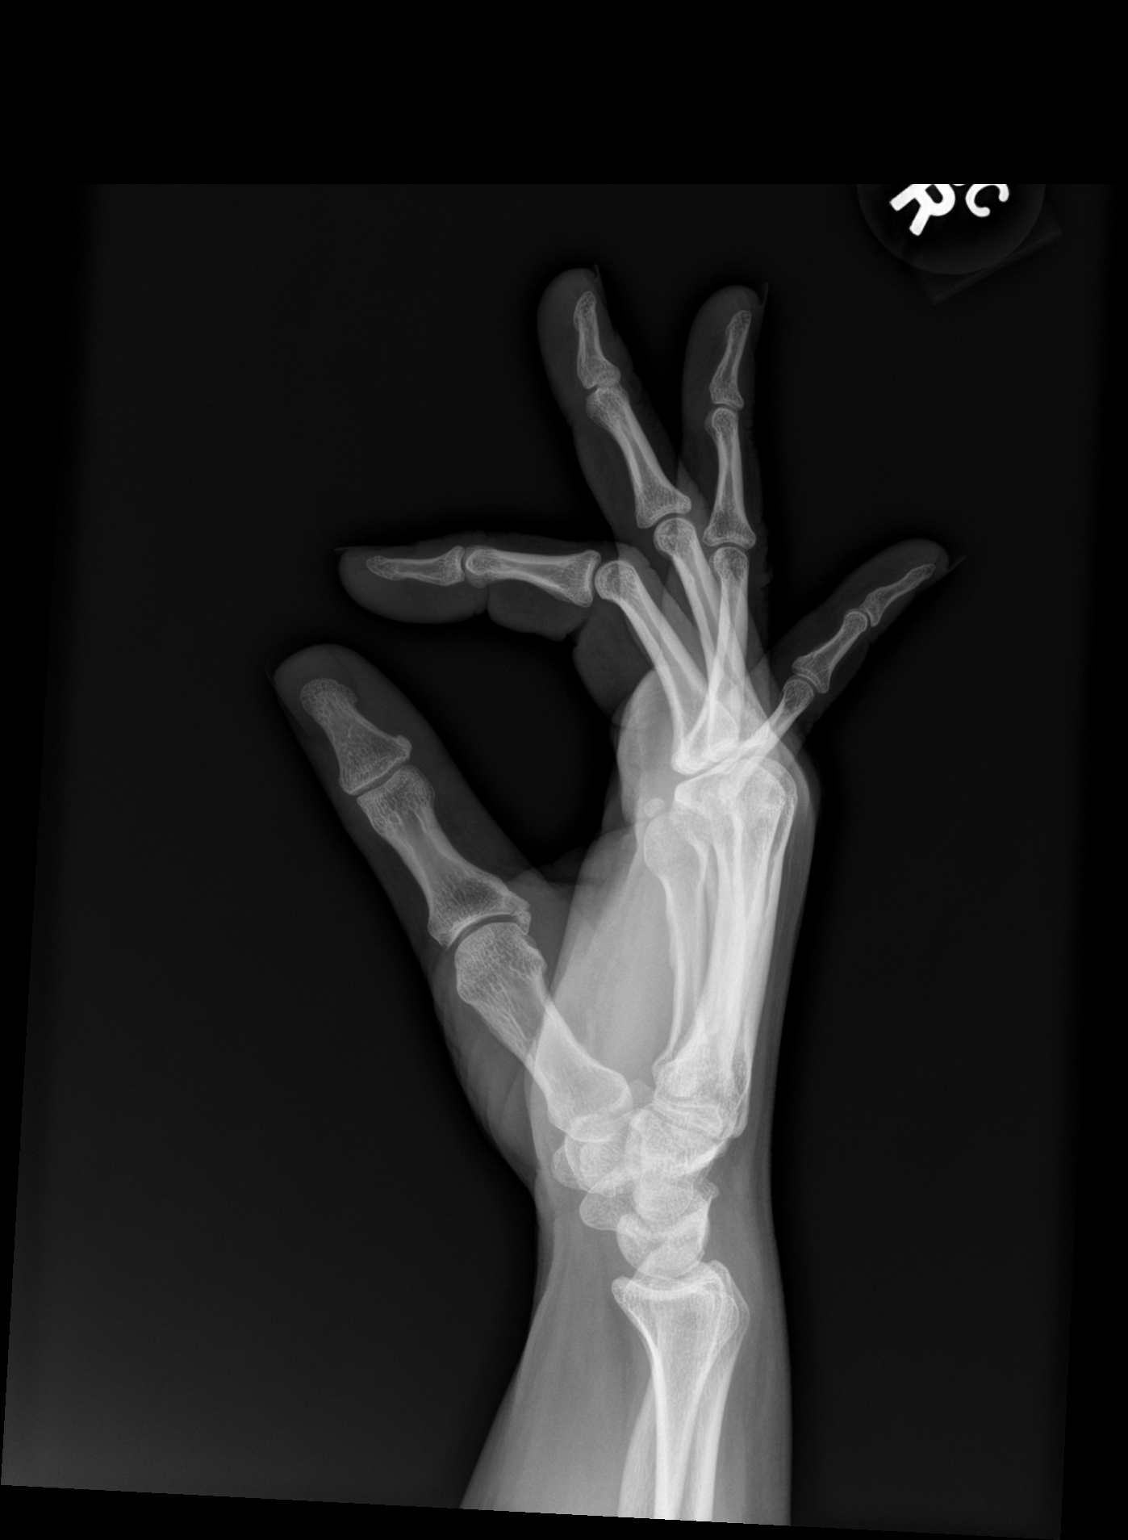

[3 of 3 positions shown; findings below may reference images not displayed]

FINDINGS: Normal bone mineralization. Mild distal scaphoid subchondral cystic
change, likely degenerative. No significant joint space narrowing.
No acute fracture or dislocation.
IMPRESSION: Unchanged mild distal medial scaphoid subchondral degenerative
cystic change. Otherwise, no joint space narrowing or acute
fracture.

## 2022-11-22 ENCOUNTER — Encounter: Payer: Self-pay | Admitting: Family Medicine

## 2022-11-24 ENCOUNTER — Encounter: Payer: Self-pay | Admitting: Physician Assistant

## 2022-11-24 ENCOUNTER — Telehealth: Payer: Self-pay | Admitting: Family Medicine

## 2022-11-24 ENCOUNTER — Ambulatory Visit: Payer: Managed Care, Other (non HMO) | Admitting: Physician Assistant

## 2022-11-24 VITALS — BP 104/67 | HR 79 | Temp 98.3°F | Ht 62.0 in | Wt 145.1 lb

## 2022-11-24 DIAGNOSIS — R1012 Left upper quadrant pain: Secondary | ICD-10-CM | POA: Diagnosis not present

## 2022-11-24 DIAGNOSIS — Z8711 Personal history of peptic ulcer disease: Secondary | ICD-10-CM | POA: Diagnosis not present

## 2022-11-24 DIAGNOSIS — E278 Other specified disorders of adrenal gland: Secondary | ICD-10-CM

## 2022-11-24 MED ORDER — ONDANSETRON HCL 4 MG PO TABS: 4.0000 mg | ORAL_TABLET | Freq: Three times a day (TID) | ORAL | 0 refills | Status: DC | PRN

## 2022-11-24 MED ORDER — PANTOPRAZOLE SODIUM 40 MG PO TBEC: 40.0000 mg | DELAYED_RELEASE_TABLET | Freq: Every day | ORAL | 3 refills | Status: AC

## 2022-11-24 NOTE — Progress Notes (Signed)
Established patient visit   Patient: Melinda Munoz   DOB: 10/05/1985   37 y.o. Female  MRN: [id number] Visit Date: 11/24/2022  Today's healthcare provider: Alfredia Ferguson, PA-C   Cc. LUQ pain, nausea   Subjective     Pt was seen in the ED 10/21 for LUQ abdominal pain worse with coughing and movement. Reports N/V x 3 days, history of stomach ulcer. CT abd pelvis with no abnormalities related to her pain.   Discussed the use of AI scribe software for clinical note transcription with the patient, who gave verbal consent to proceed.  History of Present Illness   The patient, with a past medical history of a gastric ulcer, presents with worsening left-sided abdominal pain. The pain, described as being located under the ribs, started as a mild discomfort and has progressively worsened. She reports since the ED visit her nausea has persisted but the vomiting has improved.  The patient has not been eating much due to discomfort and lack of appetite. The patient also mentions a history of pleurisy and questions if the current pain could be related. Denies recent URI or cough.      No significant changes to BM.  Medications: Outpatient Medications Prior to Visit  Medication Sig   [DISCONTINUED] pantoprazole (PROTONIX) 20 MG tablet Take 20 mg by mouth daily.   No facility-administered medications prior to visit.   Review of Systems  Constitutional:  Negative for fatigue and fever.  Respiratory:  Negative for cough and shortness of breath.   Cardiovascular:  Negative for chest pain and leg swelling.  Gastrointestinal:  Positive for abdominal pain.  Neurological:  Negative for dizziness and headaches.       Objective    BP 104/67   Pulse 79   Temp 98.3 F (36.8 C) (Oral)   Ht 5\' 2"  (1.575 m)   Wt 145 lb 2 oz (65.8 kg)   SpO2 100%   BMI 26.54 kg/m    Physical Exam Constitutional:      General: She is awake.     Appearance: She is well-developed.  HENT:     Head:  Normocephalic.  Eyes:     Conjunctiva/sclera: Conjunctivae normal.  Cardiovascular:     Rate and Rhythm: Normal rate and regular rhythm.     Heart sounds: Normal heart sounds.  Pulmonary:     Effort: Pulmonary effort is normal.     Breath sounds: Normal breath sounds.  Abdominal:     Tenderness: There is abdominal tenderness in the epigastric area and left upper quadrant.  Skin:    General: Skin is warm.  Neurological:     Mental Status: She is alert and oriented to person, place, and time.  Psychiatric:        Attention and Perception: Attention normal.        Mood and Affect: Mood normal.        Speech: Speech normal.        Behavior: Behavior is cooperative.      No results found for any visits on 11/24/22.  Assessment & Plan  1. LUQ abdominal pain 2. History of peptic ulcer  Reviewed ED findings: -- CT abdomen/pelvis bilateral adrenal masses largest 1.8 cm Recommend f/u 1 year. Right simple ovarian cyst 2.1 cm Normal labs, lfts, wbc, lipase nromal ua negative  Pt had not started pantoprazole yet -- will send in 40 mg instead of 20 mg. -Prescribe Zofran for nausea as needed. -Avoid NSAIDs. -urgent  ref to GI for endoscopy -Return to ER if pain becomes constant or if hematemesis occurs.  3. Bilateral adrenal masses Incidental finding on recent CT scan. -Schedule follow-up CT scan in 1 year to monitor  Return if symptoms worsen or fail to improve.       Alfredia Ferguson, PA-C  [facility] [phone] (phone) [phone] (fax)  [facility]

## 2022-11-24 NOTE — Telephone Encounter (Signed)
Initial Comment Caller states she is having severe abdominal pain and it hurts to breath. Translation No Nurse Assessment Nurse: Carylon Perches, RN, Hilda Lias Date/Time Lamount Cohen Time): 11/24/2022 9:19:40 AM Confirm and document reason for call. If symptomatic, describe symptoms. ---Caller states she is having intermittent abdominal pain and it hurts to breathe. Pain is 7/10. Afebrile. She was seen in the ER for CT scan on [place]. Was told it could be an ulcer. Does the patient have any new or worsening symptoms? ---Yes Will a triage be completed? ---Yes Related visit to physician within the last 2 weeks? ---No Does the PT have any chronic conditions? (i.e. diabetes, asthma, this includes High risk factors for pregnancy, etc.) ---Yes List chronic conditions. ---interstitial cystitis Is the patient pregnant or possibly pregnant? (Ask all females between the ages of 62-55) ---No Is this a behavioral health or substance abuse call? ---No Guidelines Guideline Title Affirmed Question Affirmed Notes Nurse Date/Time (Eastern Time) Abdominal Pain - Female [1] MODERATE pain (e.g., interferes with normal activities) AND [2] pain comes and goes (cramps) AND [3] present > 24 hours Carylon Perches, RN, Hilda Lias 11/24/2022 9:22:16 AM PLEASE NOTE: All timestamps contained within this report are represented as [place] Standard Time. CONFIDENTIALTY NOTICE: This fax transmission is intended only for the addressee. It contains information that is legally privileged, confidential or otherwise protected from use or disclosure. If you are not the intended recipient, you are strictly prohibited from reviewing, disclosing, copying using or disseminating any of this information or taking any action in reliance on or regarding this information. If you have received this fax in error, please notify us immediately by telephone so that we can arrange for its return to [place]. Phone: [phone], Toll-Free: [phone], Fax:  [phone] Page: 2 of 2 Call Id: [id number] Guidelines Guideline Title Affirmed Question Affirmed Notes Nurse Date/Time Lamount Cohen Time) (Exception: Pain with Vomiting or Diarrhea - see that Guideline.) Disp. Time Lamount Cohen Time) Disposition Final User 11/24/2022 9:18:06 AM Send to Urgent Queue Annabell Sabal 11/24/2022 9:26:04 AM See PCP within 24 Hours Yes Carylon Perches, RN, Hilda Lias Final Disposition 11/24/2022 9:26:04 AM See PCP within 24 Hours Yes Carylon Perches, RN, Seward Grater Disagree/Comply Comply Caller Understands Yes PreDisposition Did not know what to do Care Advice Given Per Guideline SEE PCP WITHIN 24 HOURS: * IF OFFICE WILL BE OPEN: You need to be examined within the next 24 hours. Call your doctor (or NP/PA) when the office opens and make an appointment. CALL BACK IF: * Severe pain lasts over 1 hour * Constant pain lasts over 2 hours * You become worse CARE ADVICE given per Abdominal Pain - Female (Adult) guideline. Referrals REFERRED TO PCP OFFICE

## 2022-11-24 NOTE — Telephone Encounter (Signed)
FYI: This call has been transferred to triage nurse: the Triage Nurse. Once the result note has been entered staff can address the message at that time.  Patient called in with the following symptoms:  Red Word:abdominal pain and 7/10 Pain   Please advise at Mobile [phone] (mobile)  Message is routed to Provider Pool.

## 2022-11-24 NOTE — Telephone Encounter (Signed)
Appt scheduled 11/24/22 w/ Lillia Abed

## 2022-11-25 ENCOUNTER — Ambulatory Visit: Payer: Managed Care, Other (non HMO) | Admitting: Family Medicine

## 2022-11-29 ENCOUNTER — Encounter: Payer: Self-pay | Admitting: Gastroenterology

## 2023-01-26 ENCOUNTER — Ambulatory Visit (HOSPITAL_BASED_OUTPATIENT_CLINIC_OR_DEPARTMENT_OTHER)
Admission: RE | Admit: 2023-01-26 | Discharge: 2023-01-26 | Disposition: A | Discharge status: Home / Self Care | Payer: Managed Care, Other (non HMO) | Source: Ambulatory Visit | Attending: Internal Medicine | Admitting: Internal Medicine

## 2023-01-26 ENCOUNTER — Ambulatory Visit: Payer: Self-pay | Admitting: Family Medicine

## 2023-01-26 ENCOUNTER — Encounter (HOSPITAL_BASED_OUTPATIENT_CLINIC_OR_DEPARTMENT_OTHER): Payer: Self-pay

## 2023-01-26 VITALS — BP 111/75 | HR 70 | Temp 98.4°F | Resp 20 | Wt 143.0 lb

## 2023-01-26 DIAGNOSIS — J209 Acute bronchitis, unspecified: Secondary | ICD-10-CM | POA: Diagnosis not present

## 2023-01-26 DIAGNOSIS — R0602 Shortness of breath: Secondary | ICD-10-CM | POA: Diagnosis not present

## 2023-01-26 MED ORDER — PREDNISONE 20 MG PO TABS: 40.0000 mg | ORAL_TABLET | Freq: Every day | ORAL | 0 refills | Status: AC

## 2023-01-26 MED ORDER — ALBUTEROL SULFATE (2.5 MG/3ML) 0.083% IN NEBU
2.5000 mg | INHALATION_SOLUTION | Freq: Once | RESPIRATORY_TRACT | Status: AC
  Administered 2023-01-26: 2.5 mg via RESPIRATORY_TRACT

## 2023-01-26 MED ORDER — PROMETHAZINE-DM 6.25-15 MG/5ML PO SYRP: 5.0000 mL | ORAL_SOLUTION | Freq: Every evening | ORAL | 0 refills | Status: DC | PRN

## 2023-01-26 MED ORDER — ALBUTEROL SULFATE HFA 108 (90 BASE) MCG/ACT IN AERS: 1.0000 | INHALATION_SPRAY | Freq: Four times a day (QID) | RESPIRATORY_TRACT | 0 refills | Status: DC | PRN

## 2023-01-26 NOTE — Discharge Instructions (Addendum)
 Please go to med [facility] and have x-ray performed. Do not check in to the ER. Go to imaging department, get images performed, then go home. You will receive a phone call if the x-ray shows any abnormal results requiring further treatment. If the x-ray results do not change our treatment plan, you will not receive a phone call.  Also see these results on MyChart.  Med [facility] [address] [place], [place]  You have bronchitis which is inflammation of the upper airways in your lungs due to a virus. The following medicines will help with your symptoms.   - Take steroid pills sent to pharmacy as directed. Do not take any other NSAID containing medications such as ibuprofen or naproxen/Aleve while taking prednisone. - You may use albuterol inhaler 1 to 2 puffs every 4-6 hours as needed for cough, shortness of breath, and wheezing. - Take cough medicines as prescribed.  - Continue using over the counter medicines as needed as directed. Plain mucinex (guaifenesin) over the counter may further help breakup mucus and help with symptoms.   If you develop any new or worsening symptoms or do not improve in the next 2 to 3 days, please return.  If your symptoms are severe, please go to the emergency room. Follow-up with PCP as needed.
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
## 2023-01-26 NOTE — Telephone Encounter (Signed)
Chief Complaint: cough Symptoms: cough, green sputum,  Frequency: x 2 weeks Pertinent Negatives: Patient denies fever, sob, chest pain Disposition: [] ED /[x] Urgent Care (no appt availability in office) / [] Appointment(In office/virtual)/ []  [facility]/ [] Home Care/ [] Refused Recommended Disposition /[] [facility]/ []  Follow-up with PCP Additional Notes: Patient reports she has had a productive cough x 2 weeks that will not go away. Patient reports she is coughing up green phlegm and that she is coughing so hard it is making her body hurt. Patient reports she has tried OTC cough medicine and nothing is helping. Patient reports she has a history of pleurisy due to coughing and is trying to avoid this. This RN attempted to schedule in office visit and none avaiable until tomorrow. Patient requesting to be seen today so this RN offered UC appt. UC appt scheduled today 12/26. Patient advised to call back with worsening symptoms. Patient verbalized understanding.     Copied from CRM [id number]. Topic: Clinical - Medication Refill >> Jan 26, 2023 12:18 PM Sim Boast F wrote: Most Recent Primary Care Visit:  Provider: Alfredia Ferguson  Department: [facility]  Visit Type: OFFICE VISIT  Date: 11/24/2022  Medication: Cough Syrup for cough - pt has been coughing for a week and a half  Has the patient contacted their pharmacy? No (Agent: If no, request that the patient contact the pharmacy for the refill. If patient does not wish to contact the pharmacy document the reason why and proceed with request.) (Agent: If yes, when and what did the pharmacy advise?)  Is this the correct pharmacy for this prescription? Yes If no, delete pharmacy and type the correct one.  This is the patient's preferred pharmacy:   [id number] - [place], [place] - [address] [place] [address] Phone: [phone] Fax: [phone]   Has the prescription been filled  recently? No  Is the patient out of the medication? Yes  Has the patient been seen for an appointment in the last year OR does the patient have an upcoming appointment? Yes  Can we respond through MyChart? Yes  Agent: Please be advised that Rx refills may take up to 3 business days. We ask that you follow-up with your pharmacy. Reason for Disposition  SEVERE coughing spells (e.g., whooping sound after coughing, vomiting after coughing)  Answer Assessment - Initial Assessment Questions 1. ONSET: "When did the cough begin?"      2 weeks 2. SEVERITY: "How bad is the cough today?"      severe 3. SPUTUM: "Describe the color of your sputum" (none, dry cough; clear, white, yellow, green)     Yellow/green 4. HEMOPTYSIS: "Are you coughing up any blood?" If so ask: "How much?" (flecks, streaks, tablespoons, etc.)     no 5. DIFFICULTY BREATHING: "Are you having difficulty breathing?" If Yes, ask: "How bad is it?" (e.g., mild, moderate, severe)    - MILD: No SOB at rest, mild SOB with walking, speaks normally in sentences, can lie down, no retractions, pulse < 100.    - MODERATE: SOB at rest, SOB with minimal exertion and prefers to sit, cannot lie down flat, speaks in phrases, mild retractions, audible wheezing, pulse 100-120.    - SEVERE: Very SOB at rest, speaks in single words, struggling to breathe, sitting hunched forward, retractions, pulse > 120      none 6. FEVER: "Do you have a fever?" If Yes, ask: "What is your temperature, how was it measured, and when  did it start?"     no 7. CARDIAC HISTORY: "Do you have any history of heart disease?" (e.g., heart attack, congestive heart failure)      no 8. LUNG HISTORY: "Do you have any history of lung disease?"  (e.g., pulmonary embolus, asthma, emphysema)     History of pleurisy 9. PE RISK FACTORS: "Do you have a history of blood clots?" (or: recent major surgery, recent prolonged travel, bedridden)     no 10. OTHER SYMPTOMS: "Do you have any  other symptoms?" (e.g., runny nose, wheezing, chest pain)       no  Protocols used: Cough - Acute Productive-A-AH

## 2023-01-26 NOTE — ED Triage Notes (Signed)
Cough onset 2 weeks ago. States feels fine except rib soreness. Has not taken any otc meds for pain today.

## 2023-01-27 ENCOUNTER — Ambulatory Visit (INDEPENDENT_AMBULATORY_CARE_PROVIDER_SITE_OTHER): Payer: Managed Care, Other (non HMO) | Admitting: Gastroenterology

## 2023-01-27 ENCOUNTER — Ambulatory Visit (HOSPITAL_BASED_OUTPATIENT_CLINIC_OR_DEPARTMENT_OTHER)
Admission: RE | Admit: 2023-01-27 | Discharge: 2023-01-27 | Disposition: A | Discharge status: Home / Self Care | Payer: Managed Care, Other (non HMO) | Source: Ambulatory Visit | Attending: Internal Medicine

## 2023-01-27 ENCOUNTER — Encounter: Payer: Self-pay | Admitting: Gastroenterology

## 2023-01-27 VITALS — BP 102/68 | HR 74 | Ht 61.0 in | Wt 147.1 lb

## 2023-01-27 DIAGNOSIS — R062 Wheezing: Secondary | ICD-10-CM | POA: Diagnosis present

## 2023-01-27 DIAGNOSIS — R0602 Shortness of breath: Secondary | ICD-10-CM | POA: Diagnosis present

## 2023-01-27 DIAGNOSIS — R059 Cough, unspecified: Secondary | ICD-10-CM | POA: Insufficient documentation

## 2023-01-27 DIAGNOSIS — R1010 Upper abdominal pain, unspecified: Secondary | ICD-10-CM

## 2023-01-27 DIAGNOSIS — R194 Change in bowel habit: Secondary | ICD-10-CM

## 2023-01-27 DIAGNOSIS — R1012 Left upper quadrant pain: Secondary | ICD-10-CM | POA: Diagnosis not present

## 2023-01-27 DIAGNOSIS — K589 Irritable bowel syndrome without diarrhea: Secondary | ICD-10-CM | POA: Insufficient documentation

## 2023-01-27 DIAGNOSIS — R140 Abdominal distension (gaseous): Secondary | ICD-10-CM | POA: Diagnosis not present
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
## 2023-01-27 NOTE — Patient Instructions (Signed)
Low-FOD map diet information provided.   _______________________________________________________  If your blood pressure at your visit was 140/90 or greater, please contact your primary care physician to follow up on this.  _______________________________________________________  If you are age 36 or older, your body mass index should be between 23-30. Your Body mass index is 27.8 kg/m. If this is out of the aforementioned range listed, please consider follow up with your Primary Care Provider.  If you are age 68 or younger, your body mass index should be between 19-25. Your Body mass index is 27.8 kg/m. If this is out of the aformentioned range listed, please consider follow up with your Primary Care Provider.   ________________________________________________________  The [facility] providers would like to encourage you to use [facility] to communicate with providers for non-urgent requests or questions.  Due to long hold times on the telephone, sending your provider a message by [facility] may be a faster and more efficient way to get a response.  Please allow 48 business hours for a response.  Please remember that this is for non-urgent requests.  _______________________________________________________

## 2023-01-27 NOTE — Progress Notes (Signed)
X-ray normal, no signs of pneumonia. Continue with treatment plan as discussed at visit.

## 2023-01-28 NOTE — Progress Notes (Signed)
Noted  

## 2023-01-30 ENCOUNTER — Telehealth: Payer: Self-pay | Admitting: *Deleted

## 2023-01-30 DIAGNOSIS — R1010 Upper abdominal pain, unspecified: Secondary | ICD-10-CM

## 2023-01-30 NOTE — Telephone Encounter (Signed)
-----   Message from Hidalgo D. Zehr sent at 01/27/2023  4:56 PM EST ----- As I was doing her chart I thought that may be her gallbladder could be an issue as well.  She did have an ultrasound in the past, 2019.  Lets plan for a repeat right upper quadrant abdominal ultrasound and see if they can do a HIDA scan to check the function of her gallbladder on the same day as well.  Thanks!

## 2023-02-06 NOTE — Telephone Encounter (Signed)
 Patient informed.

## 2023-02-06 NOTE — Telephone Encounter (Signed)
 Ultrasound and HIDA scan scheduled for 02/13/23 @ 8:30 am at [facility] Call patient and unable to leave voicemail as mailbox is full.

## 2023-02-13 ENCOUNTER — Encounter (HOSPITAL_COMMUNITY)
Admission: RE | Admit: 2023-02-13 | Discharge: 2023-02-13 | Disposition: A | Discharge status: Home / Self Care | Payer: Managed Care, Other (non HMO) | Source: Ambulatory Visit | Attending: Gastroenterology | Admitting: Gastroenterology

## 2023-02-13 ENCOUNTER — Ambulatory Visit (HOSPITAL_COMMUNITY)
Admission: RE | Admit: 2023-02-13 | Discharge: 2023-02-13 | Disposition: A | Discharge status: Home / Self Care | Payer: Managed Care, Other (non HMO) | Source: Ambulatory Visit | Attending: Gastroenterology | Admitting: Gastroenterology

## 2023-02-13 DIAGNOSIS — R1010 Upper abdominal pain, unspecified: Secondary | ICD-10-CM

## 2023-02-13 MED ORDER — TECHNETIUM TC 99M MEBROFENIN IV KIT
5.0000 | PACK | Freq: Once | INTRAVENOUS | Status: AC
  Administered 2023-02-13: 5.36 via INTRAVENOUS

## 2023-04-20 ENCOUNTER — Encounter: Payer: Self-pay | Admitting: Family Medicine

## 2023-05-29 ENCOUNTER — Encounter: Payer: Self-pay | Admitting: Family Medicine

## 2023-07-18 ENCOUNTER — Ambulatory Visit: Payer: Self-pay | Admitting: Family Medicine

## 2023-07-18 ENCOUNTER — Ambulatory Visit: Attending: Family Medicine

## 2023-07-18 ENCOUNTER — Ambulatory Visit: Admitting: Family Medicine

## 2023-07-18 VITALS — BP 122/78 | HR 93 | Temp 98.0°F | Resp 16 | Ht 61.0 in | Wt 149.0 lb

## 2023-07-18 DIAGNOSIS — R002 Palpitations: Secondary | ICD-10-CM

## 2023-07-18 DIAGNOSIS — Z1159 Encounter for screening for other viral diseases: Secondary | ICD-10-CM | POA: Diagnosis not present

## 2023-07-18 DIAGNOSIS — R519 Headache, unspecified: Secondary | ICD-10-CM

## 2023-07-18 DIAGNOSIS — M25511 Pain in right shoulder: Secondary | ICD-10-CM | POA: Diagnosis not present

## 2023-07-18 DIAGNOSIS — G8929 Other chronic pain: Secondary | ICD-10-CM

## 2023-07-18 LAB — MAGNESIUM: Magnesium: 2.2 mg/dL (ref 1.5–2.5)

## 2023-07-18 LAB — COMPREHENSIVE METABOLIC PANEL WITH GFR
ALT: 10 U/L (ref 0–35)
AST: 13 U/L (ref 0–37)
Albumin: 4.9 g/dL (ref 3.5–5.2)
Alkaline Phosphatase: 60 U/L (ref 39–117)
BUN: 9 mg/dL (ref 6–23)
CO2: 28 meq/L (ref 19–32)
Calcium: 9.5 mg/dL (ref 8.4–10.5)
Chloride: 103 meq/L (ref 96–112)
Creatinine, Ser: 0.72 mg/dL (ref 0.40–1.20)
GFR: 107.29 mL/min (ref >60.00)
Glucose, Bld: 110 mg/dL — ABNORMAL HIGH (ref 70–99)
Potassium: 4.4 meq/L (ref 3.5–5.1)
Sodium: 139 meq/L (ref 135–145)
Total Bilirubin: 0.6 mg/dL (ref 0.2–1.2)
Total Protein: 7.4 g/dL (ref 6.0–8.3)

## 2023-07-18 LAB — CBC
HCT: 45.4 % (ref 36.0–46.0)
Hemoglobin: 15.1 g/dL — ABNORMAL HIGH (ref 12.0–15.0)
MCHC: 33.2 g/dL (ref 30.0–36.0)
MCV: 90.9 fl (ref 78.0–100.0)
Platelets: 207 10*3/uL (ref 150.0–400.0)
RBC: 5 Mil/uL (ref 3.87–5.11)
RDW: 13.7 % (ref 11.5–15.5)
WBC: 6 10*3/uL (ref 4.0–10.5)

## 2023-07-18 LAB — TSH: TSH: 1.62 u[IU]/mL (ref 0.35–5.50)
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
## 2023-07-18 NOTE — Patient Instructions (Addendum)
 Give us  2-3 business days to get the results of your labs back.   Stay hydrated.  Someone will be in touch with your monitor.   If you do not hear anything about your referral in the next 1-2 weeks, call our office and ask for an update.  Let us  know if you need anything.  EXERCISES RANGE OF MOTION (ROM) AND STRETCHING EXERCISES  These exercises may help you when beginning to rehabilitate your issue. In order to successfully resolve your symptoms, you must improve your posture. These exercises are designed to help reduce the forward-head and rounded-shoulder posture which contributes to this condition. Your symptoms may resolve with or without further involvement from your physician, physical therapist or athletic trainer. While completing these exercises, remember:  Restoring tissue flexibility helps normal motion to return to the joints. This allows healthier, less painful movement and activity. An effective stretch should be held for at least 20 seconds, although you may need to begin with shorter hold times for comfort. A stretch should never be painful. You should only feel a gentle lengthening or release in the stretched tissue. Do not do any stretch or exercise that you cannot tolerate.  STRETCH- Axial Extensors Lie on your back on the floor. You may bend your knees for comfort. Place a rolled-up hand towel or dish towel, about 2 inches in diameter, under the part of your head that makes contact with the floor. Gently tuck your chin, as if trying to make a double chin, until you feel a gentle stretch at the base of your head. Hold 15-20 seconds. Repeat 2-3 times. Complete this exercise 1 time per day.   STRETCH - Axial Extension  Stand or sit on a firm surface. Assume a good posture: chest up, shoulders drawn back, abdominal muscles slightly tense, knees unlocked (if standing) and feet hip width apart. Slowly retract your chin so your head slides back and your chin slightly lowers.  Continue to look straight ahead. You should feel a gentle stretch in the back of your head. Be certain not to feel an aggressive stretch since this can cause headaches later. Hold for 15-20 seconds. Repeat 2-3 times. Complete this exercise 1 time per day.  STRETCH - Cervical Side Bend  Stand or sit on a firm surface. Assume a good posture: chest up, shoulders drawn back, abdominal muscles slightly tense, knees unlocked (if standing) and feet hip width apart. Without letting your nose or shoulders move, slowly tip your right / left ear to your shoulder until your feel a gentle stretch in the muscles on the opposite side of your neck. Hold 15-20 seconds. Repeat 2-3 times. Complete this exercise 1-2 times per day.  STRETCH - Cervical Rotators  Stand or sit on a firm surface. Assume a good posture: chest up, shoulders drawn back, abdominal muscles slightly tense, knees unlocked (if standing) and feet hip width apart. Keeping your eyes level with the ground, slowly turn your head until you feel a gentle stretch along the back and opposite side of your neck. Hold 15-20 seconds. Repeat 2-3 times. Complete this exercise 1-2 times per day.  RANGE OF MOTION - Neck Circles  Stand or sit on a firm surface. Assume a good posture: chest up, shoulders drawn back, abdominal muscles slightly tense, knees unlocked (if standing) and feet hip width apart. Gently roll your head down and around from the back of one shoulder to the back of the other. The motion should never be forced or painful. Repeat  the motion 10-20 times, or until you feel the neck muscles relax and loosen. Repeat 2-3 times. Complete the exercise 1-2 times per day. STRENGTHENING EXERCISES - Cervical Strain and Sprain These exercises may help you when beginning to rehabilitate your injury. They may resolve your symptoms with or without further involvement from your physician, physical therapist, or athletic trainer. While completing these  exercises, remember:  Muscles can gain both the endurance and the strength needed for everyday activities through controlled exercises. Complete these exercises as instructed by your physician, physical therapist, or athletic trainer. Progress the resistance and repetitions only as guided. You may experience muscle soreness or fatigue, but the pain or discomfort you are trying to eliminate should never worsen during these exercises. If this pain does worsen, stop and make certain you are following the directions exactly. If the pain is still present after adjustments, discontinue the exercise until you can discuss the trouble with your clinician.  STRENGTH - Cervical Flexors, Isometric Face a wall, standing about 6 inches away. Place a small pillow, a ball about 6-8 inches in diameter, or a folded towel between your forehead and the wall. Slightly tuck your chin and gently push your forehead into the soft object. Push only with mild to moderate intensity, building up tension gradually. Keep your jaw and forehead relaxed. Hold 10 to 20 seconds. Keep your breathing relaxed. Release the tension slowly. Relax your neck muscles completely before you start the next repetition. Repeat 2-3 times. Complete this exercise 1 time per day.  STRENGTH- Cervical Lateral Flexors, Isometric  Stand about 6 inches away from a wall. Place a small pillow, a ball about 6-8 inches in diameter, or a folded towel between the side of your head and the wall. Slightly tuck your chin and gently tilt your head into the soft object. Push only with mild to moderate intensity, building up tension gradually. Keep your jaw and forehead relaxed. Hold 10 to 20 seconds. Keep your breathing relaxed. Release the tension slowly. Relax your neck muscles completely before you start the next repetition. Repeat 2-3 times. Complete this exercise 1 time per day.  STRENGTH - Cervical Extensors, Isometric  Stand about 6 inches away from a wall.  Place a small pillow, a ball about 6-8 inches in diameter, or a folded towel between the back of your head and the wall. Slightly tuck your chin and gently tilt your head back into the soft object. Push only with mild to moderate intensity, building up tension gradually. Keep your jaw and forehead relaxed. Hold 10 to 20 seconds. Keep your breathing relaxed. Release the tension slowly. Relax your neck muscles completely before you start the next repetition. Repeat 2-3 times. Complete this exercise 1 time per day.  POSTURE AND BODY MECHANICS CONSIDERATIONS Keeping correct posture when sitting, standing or completing your activities will reduce the stress put on different body tissues, allowing injured tissues a chance to heal and limiting painful experiences. The following are general guidelines for improved posture. Your physician or physical therapist will provide you with any instructions specific to your needs. While reading these guidelines, remember: The exercises prescribed by your provider will help you have the flexibility and strength to maintain correct postures. The correct posture provides the optimal environment for your joints to work. All of your joints have less wear and tear when properly supported by a spine with good posture. This means you will experience a healthier, less painful body. Correct posture must be practiced with all of your  activities, especially prolonged sitting and standing. Correct posture is as important when doing repetitive low-stress activities (typing) as it is when doing a single heavy-load activity (lifting).  PROLONGED STANDING WHILE SLIGHTLY LEANING FORWARD When completing a task that requires you to lean forward while standing in one place for a long time, place either foot up on a stationary 2- to 4-inch high object to help maintain the best posture. When both feet are on the ground, the low back tends to lose its slight inward curve. If this curve flattens  (or becomes too large), then the back and your other joints will experience too much stress, fatigue more quickly, and can cause pain.   RESTING POSITIONS Consider which positions are most painful for you when choosing a resting position. If you have pain with flexion-based activities (sitting, bending, stooping, squatting), choose a position that allows you to rest in a less flexed posture. You would want to avoid curling into a fetal position on your side. If your pain worsens with extension-based activities (prolonged standing, working overhead), avoid resting in an extended position such as sleeping on your stomach. Most people will find more comfort when they rest with their spine in a more neutral position, neither too rounded nor too arched. Lying on a non-sagging bed on your side with a pillow between your knees, or on your back with a pillow under your knees will often provide some relief. Keep in mind, being in any one position for a prolonged period of time, no matter how correct your posture, can still lead to stiffness.  WALKING Walk with an upright posture. Your ears, shoulders, and hips should all line up. OFFICE WORK When working at a desk, create an environment that supports good, upright posture. Without extra support, muscles fatigue and lead to excessive strain on joints and other tissues.  CHAIR: A chair should be able to slide under your desk when your back makes contact with the back of the chair. This allows you to work closely. The chair's height should allow your eyes to be level with the upper part of your monitor and your hands to be slightly lower than your elbows. Body position: Your feet should make contact with the floor. If this is not possible, use a foot rest. Keep your ears over your shoulders. This will reduce stress on your neck and low back.  Trapezius stretches/exercises Do exercises exactly as told by your health care provider and adjust them as directed. It  is normal to feel mild stretching, pulling, tightness, or discomfort as you do these exercises, but you should stop right away if you feel sudden pain or your pain gets worse.   Stretching and range of motion exercises These exercises warm up your muscles and joints and improve the movement and flexibility of your shoulder. These exercises can also help to relieve pain, numbness, and tingling. If you are unable to do any of the following for any reason, do not further attempt to do it.   Exercise A: Flexion, standing     Stand and hold a broomstick, a cane, or a similar object. Place your hands a little more than shoulder-width apart on the object. Your left / right hand should be palm-up, and your other hand should be palm-down. Push the stick to raise your left / right arm out to your side and then over your head. Use your other hand to help move the stick. Stop when you feel a stretch in your shoulder, or  when you reach the angle that is recommended by your health care provider. Avoid shrugging your shoulder while you raise your arm. Keep your shoulder blade tucked down toward your spine. Hold for 30 seconds. Slowly return to the starting position. Repeat 2 times. Complete this exercise 3 times per week.  Exercise B: Abduction, supine     Lie on your back and hold a broomstick, a cane, or a similar object. Place your hands a little more than shoulder-width apart on the object. Your left / right hand should be palm-up, and your other hand should be palm-down. Push the stick to raise your left / right arm out to your side and then over your head. Use your other hand to help move the stick. Stop when you feel a stretch in your shoulder, or when you reach the angle that is recommended by your health care provider. Avoid shrugging your shoulder while you raise your arm. Keep your shoulder blade tucked down toward your spine. Hold for 30 seconds. Slowly return to the starting position. Repeat 2  times. Complete this exercise 3 times per week.  Exercise C: Flexion, active-assisted     Lie on your back. You may bend your knees for comfort. Hold a broomstick, a cane, or a similar object. Place your hands about shoulder-width apart on the object. Your palms should face toward your feet. Raise the stick and move your arms over your head and behind your head, toward the floor. Use your healthy arm to help your left / right arm move farther. Stop when you feel a gentle stretch in your shoulder, or when you reach the angle where your health care provider tells you to stop. Hold for 30 seconds. Slowly return to the starting position. Repeat 2 times. Complete this exercise 3 times per week.  Exercise D: External rotation and abduction     Stand in a door frame with one of your feet slightly in front of the other. This is called a staggered stance. Choose one of the following positions as told by your health care provider: Place your hands and forearms on the door frame above your head. Place your hands and forearms on the door frame at the height of your head. Place your hands on the door frame at the height of your elbows. Slowly move your weight onto your front foot until you feel a stretch across your chest and in the front of your shoulders. Keep your head and chest upright and keep your abdominal muscles tight. Hold for 30 seconds. To release the stretch, shift your weight to your back foot. Repeat 2 times. Complete this stretch 3 times per week.  Strengthening exercises These exercises build strength and endurance in your shoulder. Endurance is the ability to use your muscles for a long time, even after your muscles get tired. Exercise E: Scapular depression and adduction  Sit on a stable chair. Support your arms in front of you with pillows, armrests, or a tabletop. Keep your elbows in line with the sides of your body. Gently move your shoulder blades down toward your middle back.  Relax the muscles on the tops of your shoulders and in the back of your neck. Hold for 3 seconds. Slowly release the tension and relax your muscles completely before doing this exercise again. Repeat for a total of 10 repetitions. After you have practiced this exercise, try doing the exercise without the arm support. Then, try the exercise while standing instead of sitting. Repeat 2  times. Complete this exercise 3 times per week.  Exercise F: Shoulder abduction, isometric     Stand or sit about 4-6 inches (10-15 cm) from a wall with your left / right side facing the wall. Bend your left / right elbow and gently press your elbow against the wall. Increase the pressure slowly until you are pressing as hard as you can without shrugging your shoulder. Hold for 3 seconds. Slowly release the tension and relax your muscles completely. Repeat for a total of 10 repetitions. Repeat 2 times. Complete this exercise 3 times per week.  Exercise G: Shoulder flexion, isometric     Stand or sit about 4-6 inches (10-15 cm) away from a wall with your left / right side facing the wall. Keep your left / right elbow straight and gently press the top of your fist against the wall. Increase the pressure slowly until you are pressing as hard as you can without shrugging your shoulder. Hold for 10-15 seconds. Slowly release the tension and relax your muscles completely. Repeat for a total of 10 repetitions. Repeat 2 times. Complete this exercise 3 times per week.  Exercise H: Internal rotation     Sit in a stable chair without armrests, or stand. Secure an exercise band at your left / right side, at elbow height. Place a soft object, such as a folded towel or a small pillow, under your left / right upper arm so your elbow is a few inches (about 8 cm) away from your side. Hold the end of the exercise band so the band stretches. Keeping your elbow pressed against the soft object under your arm, move your forearm  across your body toward your abdomen. Keep your body steady so the movement is only coming from your shoulder. Hold for 3 seconds. Slowly return to the starting position. Repeat for a total of 10 repetitions. Repeat 2 times. Complete this exercise 3 times per week.  Exercise I: External rotation     Sit in a stable chair without armrests, or stand. Secure an exercise band at your left / right side, at elbow height. Place a soft object, such as a folded towel or a small pillow, under your left / right upper arm so your elbow is a few inches (about 8 cm) away from your side. Hold the end of the exercise band so the band stretches. Keeping your elbow pressed against the soft object under your arm, move your forearm out, away from your abdomen. Keep your body steady so the movement is only coming from your shoulder. Hold for 3 seconds. Slowly return to the starting position. Repeat for a total of 10 repetitions. Repeat 2 times. Complete this exercise 3 times per week. Exercise J: Shoulder extension  Sit in a stable chair without armrests, or stand. Secure an exercise band to a stable object in front of you so the band is at shoulder height. Hold one end of the exercise band in each hand. Your palms should face each other. Straighten your elbows and lift your hands up to shoulder height. Step back, away from the secured end of the exercise band, until the band stretches. Squeeze your shoulder blades together and pull your hands down to the sides of your thighs. Stop when your hands are straight down by your sides. Do not let your hands go behind your body. Hold for 3 seconds. Slowly return to the starting position. Repeat for a total of 10 repetitions. Repeat 2 times. Complete this exercise  3 times per week.  Exercise K: Shoulder extension, prone     Lie on your abdomen on a firm surface so your left / right arm hangs over the edge. Hold a 5 lb weight in your hand so your palm faces in  toward your body. Your arm should be straight. Squeeze your shoulder blade down toward the middle of your back. Slowly raise your arm behind you, up to the height of the surface that you are lying on. Keep your arm straight. Hold for 3 seconds. Slowly return to the starting position and relax your muscles. Repeat for a total of 10 repetitions. Repeat 2 times. Complete this exercise 3 times per week.   Exercise L: Horizontal abduction, prone  Lie on your abdomen on a firm surface so your left / right arm hangs over the edge. Hold a 5 lb weight in your hand so your palm faces toward your feet. Your arm should be straight. Squeeze your shoulder blade down toward the middle of your back. Bend your elbow so your hand moves up, until your elbow is bent to an L shape (90 degrees). With your elbow bent, slowly move your forearm forward and up. Raise your hand up to the height of the surface that you are lying on. Your upper arm should not move, and your elbow should stay bent. At the top of the movement, your palm should face the floor. Hold for 3 seconds. Slowly return to the starting position and relax your muscles. Repeat for a total of 10 repetitions. Repeat 2 times. Complete this exercise 3 times per week.  Exercise M: Horizontal abduction, standing  Sit on a stable chair, or stand. Secure an exercise band to a stable object in front of you so the band is at shoulder height. Hold one end of the exercise band in each hand. Straighten your elbows and lift your hands straight in front of you, up to shoulder height. Your palms should face down, toward the floor. Step back, away from the secured end of the exercise band, until the band stretches. Move your arms out to your sides, and keep your arms straight. Hold for 3 seconds. Slowly return to the starting position. Repeat for a total of 10 repetitions. Repeat 2 times. Complete this exercise 3 times per week.  Exercise N: Scapular retraction  and elevation  Sit on a stable chair, or stand. Secure an exercise band to a stable object in front of you so the band is at shoulder height. Hold one end of the exercise band in each hand. Your palms should face each other. Sit in a stable chair without armrests, or stand. Step back, away from the secured end of the exercise band, until the band stretches. Squeeze your shoulder blades together and lift your hands over your head. Keep your elbows straight. Hold for 3 seconds. Slowly return to the starting position. Repeat for a total of 10 repetitions. Repeat 2 times. Complete this exercise 3 times per week.  This information is not intended to replace advice given to you by your health care provider. Make sure you discuss any questions you have with your health care provider. Document Released: 01/17/2005 Document Revised: 09/24/2015 Document Reviewed: 12/04/2014 Elsevier Interactive Patient Education  2017 [employer].

## 2023-07-18 NOTE — Progress Notes (Unsigned)
 EP to read.

## 2023-07-19 ENCOUNTER — Other Ambulatory Visit: Payer: Self-pay

## 2023-07-19 DIAGNOSIS — D582 Other hemoglobinopathies: Secondary | ICD-10-CM

## 2023-07-19 LAB — HEPATITIS C ANTIBODY: Hepatitis C Ab: NONREACTIVE

## 2023-07-25 ENCOUNTER — Other Ambulatory Visit (INDEPENDENT_AMBULATORY_CARE_PROVIDER_SITE_OTHER)

## 2023-07-25 ENCOUNTER — Ambulatory Visit: Payer: Self-pay | Admitting: Family Medicine

## 2023-07-25 DIAGNOSIS — D582 Other hemoglobinopathies: Secondary | ICD-10-CM | POA: Diagnosis not present

## 2023-07-25 LAB — CBC WITH DIFFERENTIAL/PLATELET
Basophils Absolute: 0 10*3/uL (ref 0.0–0.1)
Basophils Relative: 0.6 % (ref 0.0–3.0)
Eosinophils Absolute: 0 10*3/uL (ref 0.0–0.7)
Eosinophils Relative: 0.8 % (ref 0.0–5.0)
HCT: 43.5 % (ref 36.0–46.0)
Hemoglobin: 14.5 g/dL (ref 12.0–15.0)
Lymphocytes Relative: 29 % (ref 12.0–46.0)
Lymphs Abs: 1.5 10*3/uL (ref 0.7–4.0)
MCHC: 33.3 g/dL (ref 30.0–36.0)
MCV: 90.5 fl (ref 78.0–100.0)
Monocytes Absolute: 0.3 10*3/uL (ref 0.1–1.0)
Monocytes Relative: 6.4 % (ref 3.0–12.0)
Neutro Abs: 3.2 10*3/uL (ref 1.4–7.7)
Neutrophils Relative %: 63.2 % (ref 43.0–77.0)
Platelets: 203 10*3/uL (ref 150.0–400.0)
RBC: 4.8 Mil/uL (ref 3.87–5.11)
RDW: 13.5 % (ref 11.5–15.5)
WBC: 5.1 10*3/uL (ref 4.0–10.5)

## 2023-07-27 LAB — ERYTHROPOIETIN: Erythropoietin: 4.8 m[IU]/mL (ref 2.6–18.5)

## 2023-07-28 LAB — JAK2 GENOTYPR

## 2023-11-20 ENCOUNTER — Other Ambulatory Visit: Payer: Self-pay | Admitting: Family Medicine

## 2023-11-20 DIAGNOSIS — E278 Other specified disorders of adrenal gland: Secondary | ICD-10-CM

## 2023-11-21 ENCOUNTER — Telehealth: Payer: Self-pay

## 2023-11-21 NOTE — Telephone Encounter (Signed)
 Sent pt message and tried calling VM fill not able to  Lvm.

## 2023-12-11 ENCOUNTER — Ambulatory Visit (HOSPITAL_BASED_OUTPATIENT_CLINIC_OR_DEPARTMENT_OTHER)
